# Patient Record
Sex: Female | Born: 1970 | Race: White | Hispanic: No | Marital: Single | Smoking: Current every day smoker
Health system: Southern US, Community
[De-identification: ages and names within clinical notes are randomized; demographics above are authoritative.]

## PROBLEM LIST (undated history)

## (undated) DIAGNOSIS — J189 Pneumonia, unspecified organism: Secondary | ICD-10-CM

## (undated) DIAGNOSIS — B1920 Unspecified viral hepatitis C without hepatic coma: Secondary | ICD-10-CM

## (undated) DIAGNOSIS — F1910 Other psychoactive substance abuse, uncomplicated: Secondary | ICD-10-CM

## (undated) DIAGNOSIS — I10 Essential (primary) hypertension: Secondary | ICD-10-CM

## (undated) DIAGNOSIS — Z8489 Family history of other specified conditions: Secondary | ICD-10-CM

## (undated) DIAGNOSIS — K219 Gastro-esophageal reflux disease without esophagitis: Secondary | ICD-10-CM

## (undated) DIAGNOSIS — F319 Bipolar disorder, unspecified: Secondary | ICD-10-CM

## (undated) HISTORY — PX: APPENDECTOMY: SHX54

## (undated) HISTORY — PX: ABDOMINAL HYSTERECTOMY: SHX81

## (undated) HISTORY — PX: VASCULAR SURGERY: SHX849

---

## 2004-05-19 ENCOUNTER — Inpatient Hospital Stay (HOSPITAL_COMMUNITY): Admission: EM | Admit: 2004-05-19 | Discharge: 2004-05-24 | Payer: Self-pay | Admitting: Psychiatry

## 2014-09-04 ENCOUNTER — Emergency Department (HOSPITAL_COMMUNITY)
Admission: EM | Admit: 2014-09-04 | Discharge: 2014-09-04 | Disposition: A | Discharge status: Home / Self Care | Payer: Medicare HMO | Attending: Emergency Medicine | Admitting: Emergency Medicine

## 2014-09-04 ENCOUNTER — Encounter (HOSPITAL_COMMUNITY): Payer: Self-pay | Admitting: Emergency Medicine

## 2014-09-04 DIAGNOSIS — Z720 Tobacco use: Secondary | ICD-10-CM | POA: Diagnosis not present

## 2014-09-04 DIAGNOSIS — Z79899 Other long term (current) drug therapy: Secondary | ICD-10-CM | POA: Diagnosis not present

## 2014-09-04 DIAGNOSIS — I10 Essential (primary) hypertension: Secondary | ICD-10-CM | POA: Diagnosis not present

## 2014-09-04 DIAGNOSIS — F1110 Opioid abuse, uncomplicated: Secondary | ICD-10-CM | POA: Diagnosis present

## 2014-09-04 DIAGNOSIS — F1123 Opioid dependence with withdrawal: Secondary | ICD-10-CM | POA: Insufficient documentation

## 2014-09-04 HISTORY — DX: Essential (primary) hypertension: I10

## 2014-09-04 NOTE — ED Notes (Signed)
Bed: WA06 Expected date:  Expected time:  Means of arrival:  Comments: EMS- heroin withdrawl

## 2014-09-04 NOTE — Discharge Instructions (Signed)
Chemical Dependency °Chemical dependency is an addiction to drugs or alcohol. It is characterized by the repeated behavior of seeking out and using drugs and alcohol despite harmful consequences to the health and safety of ones self and others.  °RISK FACTORS °There are certain situations or behaviors that increase a person's risk for chemical dependency. These include: °· A family history of chemical dependency. °· A history of mental health issues, including depression and anxiety. °· A home environment where drugs and alcohol are easily available to you. °· Drug or alcohol use at a young age. °SYMPTOMS  °The following symptoms can indicate chemical dependency: °· Inability to limit the use of drugs or alcohol. °· Nausea, sweating, shakiness, and anxiety that occurs when alcohol or drugs are not being used. °· An increase in amount of drugs or alcohol that is necessary to get drunk or high. °People who experience these symptoms can assess their use of drugs and alcohol by asking themselves the following questions: °· Have you been told by friends or family that they are worried about your use of alcohol or drugs? °· Do friends and family ever tell you about things you did while drinking alcohol or using drugs that you do not remember? °· Do you lie about using alcohol or drugs or about the amounts you use? °· Do you have difficulty completing daily tasks unless you use alcohol or drugs? °· Is the level of your work or school performance lower because of your drug or alcohol use? °· Do you get sick from using drugs or alcohol but keep using anyway? °· Do you feel uncomfortable in social situations unless you use alcohol or drugs? °· Do you use drugs or alcohol to help forget problems?  °An answer of yes to any of these questions may indicate chemical dependency. Professional evaluation is suggested. °Document Released: 10/12/2001 Document Revised: 01/10/2012 Document Reviewed: 12/24/2010 °ExitCare® Patient  Information ©2015 [employer]. This information is not intended to replace advice given to you by your health care provider. Make sure you discuss any questions you have with your health care provider. ° °Emergency Department Resource Guide °1) Find a Doctor and Pay Out of Pocket °Although you won't have to find out who is covered by your insurance plan, it is a good idea to ask around and get recommendations. You will then need to call the office and see if the doctor you have chosen will accept you as a new patient and what types of options they offer for patients who are self-pay. Some doctors offer discounts or will set up payment plans for their patients who do not have insurance, but you will need to ask so you aren't surprised when you get to your appointment. ° °2) Contact Your Local Health Department °Not all health departments have doctors that can see patients for sick visits, but many do, so it is worth a call to see if yours does. If you don't know where your local health department is, you can check in your phone book. The CDC also has a tool to help you locate your state's health department, and many state websites also have listings of all of their local health departments. ° °3) Find a Walk-in Clinic °If your illness is not likely to be very severe or complicated, you may want to try a walk in clinic. These are popping up all over the country in pharmacies, drugstores, and shopping centers. They're usually staffed by nurse practitioners or physician assistants that have been   trained to treat common illnesses and complaints. They're usually fairly quick and inexpensive. However, if you have serious medical issues or chronic medical problems, these are probably not your best option. ° °No Primary Care Doctor: °- Call [facility] at  832-8000 - they can help you locate a primary care doctor that  accepts your insurance, provides certain services, etc. °- Physician Referral Service-  1-800-533-3463 ° °Chronic Pain Problems: °Organization         Address  Phone   Notes  °[facility]  (336) 297-2271 Patients need to be referred by their primary care doctor.  ° °Medication Assistance: °Organization         Address  Phone   Notes  °[place] [facility] [address] °[place], [place] [address] (336) 641-8030 --Must be a resident of [place] °-- Must have NO insurance coverage whatsoever (no Medicaid/ Medicare, etc.) °-- The pt. MUST have a primary care doctor that directs their care regularly and follows them in the community °  °[facility]  (866) 331-1348   °[employer]  (888) 892-1162   ° °Agencies that provide inexpensive medical care: °Organization         Address  Phone   Notes  °[facility]  (336) 832-8035   °[facility]    (336) 832-7272   °[facility] [address] °[place], [place] [address] (336) 832-4777   °[facility] [address], °[place] (336) 271-4999   °[facility]    (336) 373-0678   °[facility]    (336) 272-1050   °[facility] ° [address], [place] Phone:  (336) 832-4444, Fax:  (336) 832-4440 Hours of Operation:  9 am - 6 pm, M-F.  Also accepts Medicaid/Medicare and self-pay.  °[facility] ° [address], [place] Phone: (336) 832-3150, Fax: (336) 832-3151. Hours of Operation:  8:30 am - 5:30 pm, M-F.  Also accepts Medicaid and self-pay.  °[facility] [place] [address], [place] Phone: (336) 878-6027   °[facility] [address], [place], [place] (336)723-1848, Ext. 123 Mondays & Thursdays: 7-9 AM.  First 15 patients are seen on a first come, first serve basis. °  ° °Medicaid-accepting [place] Providers: ° °Organization         Address  Phone   Notes  °[facility] [address],  [place] (336) 641-2100 Also accepts self-pay patients.  °[facility] [address], [place] ° (336) 856-9996   °[facility] [address], [place] (336) 288-8857   °[facility] [address] [place] [address], [place] (336) 299-7000   °Veita Bland [address], [place]  ° (336) 373-1557 Only accepts [place] Access Medicaid patients after they have their name applied to their card.  ° °Self-Pay (no insurance) in [place]: ° °Organization         Address  Phone   Notes  °Sickle Cell Patients, [facility] [address], [place] (336) 832-1970   °[facility] [address], [place] (336) 832-4400   ° [facility] ° [address], [place] (336) 992-4800   °[facility]/Dr. Osei-Bonsu ° [address] [place] [address],  or [address], [place] (336) 841-8500 Phone number for both [place] and [place] locations is the same.  °[facility] [address], [place] (336) 299-0000   °[facility] [address] [place] [address], [place] or [address] (336) 852-7530 °(336) 878-2260   °[facility] [address], [place] (336) 350-1642, phone; (336) 294-5005, fax Sees patients 1st and 3rd Saturday of every month.  Must not qualify for public or private insurance (i.e. Medicaid, Medicare, [place] Health Choice, Veterans' Benefits) • Household income should be no more than 200% of the poverty level •The clinic cannot treat you if you are pregnant or think you are pregnant • Sexually transmitted diseases are not treated at the clinic.  ° ° °Dental Care: °Organization         Address  Phone  Notes  °[facility] [address], [place] (336) 641-6152 Accepts children up to age 21 who are enrolled in  Medicaid or [place] Health Choice; pregnant women with a Medicaid card; and children who have applied for Medicaid or [place] Health Choice, but were declined, whose parents can pay a reduced fee at time of service.  °[facility] [place]  [address], [place] (336) 641-7733 Accepts children up to age 21 who are enrolled in Medicaid or [place] Health Choice; pregnant women with a Medicaid card; and children who have applied for Medicaid or [place] Health Choice, but were declined, whose parents can pay a reduced fee at time of service.  °[facility] ° [address], [place] (336) 641-4533 Patients are seen by appointment only. Walk-ins are not accepted. [facility] will see patients 18 years of age and older. °Monday - Tuesday (8am-5pm) °Most Wednesdays (8:30-5pm) °$30 per visit, cash only  °[facility] ° [address], [place] (336) 641-4533 Patients are seen by appointment only. Walk-ins are not accepted. [facility] will see patients 18 years of age and older. °One Wednesday Evening (Monthly: Volunteer Based).  $30 per visit, cash only  °[facility]  (919) 537-3737 for adults; Children under age 4, call Graduate Pediatric Dentistry at (919) [phone]. Children aged 4-14, please call (919) [phone] to request a pediatric application. ° Dental services are provided in all areas of dental care including fillings, crowns and bridges, complete and partial dentures, implants, gum treatment, root canals, and extractions. Preventive care is also provided. Treatment is provided to both adults and children. °Patients are selected via a lottery and there is often a waiting list. °  °[facility] [address], ° ° (336) 763-8833 [email or web address] °  °[facility] [address], [place], [place] (336)723-1848, Ext. 123 Second and Fourth Thursday of each month, opens at 6:30  AM; Clinic ends at 9 AM.  Patients are seen on a first-come first-served basis, and a limited number are seen during each clinic.  ° °[facility] ° [address], [place], [place] (336) 723-7904   Eligibility Requirements °You must have lived in [place], or [place] counties for at least the last three months. °  You cannot be eligible for state or federal sponsored healthcare insurance, including Veterans Administration, Medicaid, or Medicare. °  You generally cannot be eligible for healthcare insurance through your employer.  °  How to apply: °Eligibility screenings are   held every Tuesday and Wednesday afternoon from 1:00 pm until 4:00 pm. You do not need an appointment for the interview!  °[facility] [address], [place], [place] 336-631-2330   °[facility]  336-342-8273   °[facility]  336-703-3100   °[facility]  336-570-6415   ° °Behavioral Health Resources in the Community: °Intensive Outpatient Programs °Organization         Address  Phone  Notes  °[facility] [address], [place], [place] 336-878-6098   °[facility] [address], [place], [place] 336-832-9800   °[facility] [address], [place], [place] ° 336-882-2125   °[facility] [address],  °[place], [place] 1-800-853-5163 or 336-641-4981   °Substance Abuse Resources °Organization         Address  Phone  Notes  °[facility]  336-882-2125   °[facility]  336-784-9470   °[facility]  336-285-9073   °[facility]  336-845-3988   °Residential & Outpatient Substance Abuse Program  1-800-659-3381   °Psychological Services °Organization         Address  Phone  Notes  °[facility]  336- 832-9600   °[facility]  336- 378-7881   °[facility] [address], [place] 1-800-853-5163 or  336-641-4981   ° °Mobile Crisis Teams °Organization         Address  Phone  Notes  °[facility], Mobile Crisis Care Unit  1-877-626-1772   °Assertive °[facility] ° [address] [place], [place] 336-834-9664   °Sharon DeEsch [address] °[place] 336-554-5454   ° °Self-Help/Support Groups °Organization         Address  Phone             Notes  °[facility] - variety of support groups  336- 373-1402 Call for more information  °Narcotics Anonymous (NA), [facility] [address], °[place]  2 meetings at this location  ° °Residential Treatment Programs °Organization         Address  Phone  Notes  °[facility] [address],    °[place]  1-866-801-8205   °[facility] ° [address], [place], [place] 704-293-8524   °[facility] [address], [place] 336-845-3988 Admissions: 8am-3pm M-F  °[facility] [address]    °[place], [place] 336-841-1104   °[facility] [address], [place], [place] 336-379-7146   °[facility] [address]  °[place], [place] 336-285-9073   °[facility] - Intensive Outpatient [address], [place], [place] 336-852-3033   °[facility] [address]  °[place], [place] 1-877-615-2722 or 336-784-9470   °[facility] [address] [place], [place] 336-227-7417 Accepts Medicaid  °[facility] [address]  °[place] 1-800-659-3381 Substance Abuse/Addiction Treatment  ° °[place] Behavioral Health Resources °Organization         Address  Phone  Notes  °[facility]  (888) 581-9988   °Julie Brannon, PhD [address] [place], [place]   (336) 349-5553 or (336) 951-0000   °[facility]   [address] °[place], [place] (336) 349-4454   °[facility] [address],  [place],  (336) 342-8316 Insurance/Medicaid/sponsorship   through [facility]  °[facility] [address]                                    [place], [place] (336) 342-8316 Therapy/tele-psych/case  °[facility] [address]  ° [place], [place] (336) 349-2233    °Dr. Arfeen  (336) 349-4544   °[facility] [place]  [employer] [place] [facility] 1) [address], [place] °2) [address], [place] °3)  [address], [place] (336) 349-3220 °(336) 342-7768 ° °(336) 342-8140   °[place] [facility] (336) 342-1394 or (336) 342-3537 (After Hours)    ° ° °

## 2014-09-04 NOTE — ED Notes (Signed)
Patient refused to sign and go over discharge information. Showed patient and friend [facility] information on AVS as requested. Friend currently verbally accosting staff because we do not have heroin withdrawal resources.

## 2014-09-04 NOTE — ED Notes (Signed)
Per EMS-usually has 2 grams of Heroin but only had 0.5 grams today. C/o generalized pain. Attempting to set up with a clinic at this time. A&Ox4. Dry heaves with nausea noted. Says she has had withdrawals in the past and this feels the same. VS: BP 128/80 CBG 130 HR 90 Temp WDL (97 F). Hx Bipolar. Takes Lisinopril at home.

## 2014-09-04 NOTE — ED Provider Notes (Signed)
CSN: [id number]     Arrival date & time 09/04/14  1422 History   First MD Initiated Contact with Patient 09/04/14 1500     Chief Complaint  Patient presents with  . Withdrawal    Heroin     (Consider location/radiation/quality/duration/timing/severity/associated sxs/prior Treatment) HPI Comments: 3F using heroin for last year, wants detox. No suicidal ideation, no homicidal ideation. No concurrent drug use.   Patient is a 43 y.o. female presenting with drug problem. The history is provided by the patient.  Drug Problem This is a chronic problem. The current episode started more than 1 week ago. The problem occurs constantly. The problem has not changed since onset.Pertinent negatives include no chest pain and no abdominal pain. Nothing aggravates the symptoms. Nothing relieves the symptoms.    Past Medical History  Diagnosis Date  . Hypertension    Past Surgical History  Procedure Laterality Date  . Vascular surgery    . Appendectomy     History reviewed. No pertinent family history. History  Substance Use Topics  . Smoking status: Current Every Day Smoker -- 1.00 packs/day    Types: Cigarettes  . Smokeless tobacco: Not on file  . Alcohol Use: Yes   OB History    No data available     Review of Systems  Constitutional: Negative for fever and chills.  Cardiovascular: Negative for chest pain.  Gastrointestinal: Negative for abdominal pain.  All other systems reviewed and are negative.     Allergies  Review of patient's allergies indicates no known allergies.  Home Medications   Prior to Admission medications   Medication Sig Start Date End Date Taking? Authorizing Provider  Aspirin-Acetaminophen (GOODYS BODY PAIN PO) Take 1 tablet by mouth 3 (three) times daily as needed (back pain).   Yes Historical Provider, MD  clonazePAM (KLONOPIN) 1 MG tablet Take 2 mg by mouth once.   Yes Historical Provider, MD  lisinopril (PRINIVIL,ZESTRIL) 10 MG tablet Take 10 mg by  mouth daily.   Yes Historical Provider, MD  omeprazole (PRILOSEC) 20 MG capsule Take 20 mg by mouth daily.   Yes Historical Provider, MD  pregabalin (LYRICA) 75 MG capsule Take 75 mg by mouth 2 (two) times daily.   Yes Historical Provider, MD   BP 128/79 mmHg  Pulse 79  Temp(Src) 97.9 F (36.6 C) (Oral)  Resp 16  Ht 5\' 9"  (1.753 m)  Wt 166 lb (75.297 kg)  BMI 24.50 kg/m2  SpO2 100% Physical Exam  Constitutional: She is oriented to person, place, and time. She appears well-developed and well-nourished. No distress.  HENT:  Head: Normocephalic and atraumatic.  Mouth/Throat: Oropharynx is clear and moist.  Eyes: EOM are normal. Pupils are equal, round, and reactive to light.  Neck: Normal range of motion. Neck supple.  Cardiovascular: Normal rate and regular rhythm.  Exam reveals no friction rub.   No murmur heard. Pulmonary/Chest: Effort normal and breath sounds normal. No respiratory distress. She has no wheezes. She has no rales.  Abdominal: Soft. She exhibits no distension. There is no tenderness. There is no rebound.  Musculoskeletal: Normal range of motion. She exhibits no edema.  Neurological: She is alert and oriented to person, place, and time. No cranial nerve deficit. She exhibits normal muscle tone. Coordination normal.  Skin: No rash noted. She is not diaphoretic.  Psychiatric: She expresses no homicidal and no suicidal ideation. She expresses no suicidal plans and no homicidal plans.  Nursing note and vitals reviewed.   ED Course  Procedures (including critical care time) Labs Review Labs Reviewed - No data to display  Imaging Review No results found.   EKG Interpretation None      MDM   Final diagnoses:  Heroin abuse    89F here wanting opioid detox - from heroin. Denies SI/HI. Did not go to any behavioral health services prior to here. I explained to her we do not offer inpatient detox for heroin out of our ER at this time. Given resource guide for  follow up.     Elwin MochaBlair Janila Arrazola, MD 09/04/14 [phone]

## 2014-09-04 NOTE — ED Notes (Signed)
Per CSW, [facility] is not longer offering and should not be considered Medical Clearance.  Primary RN notified.

## 2014-10-21 ENCOUNTER — Ambulatory Visit: Payer: Medicare HMO | Admitting: Internal Medicine

## 2014-11-04 ENCOUNTER — Encounter (HOSPITAL_COMMUNITY): Payer: Self-pay | Admitting: *Deleted

## 2014-11-04 ENCOUNTER — Emergency Department (HOSPITAL_COMMUNITY)
Admission: EM | Admit: 2014-11-04 | Discharge: 2014-11-04 | Disposition: A | Discharge status: Home / Self Care | Payer: Medicare HMO | Attending: Emergency Medicine | Admitting: Emergency Medicine

## 2014-11-04 DIAGNOSIS — K0889 Other specified disorders of teeth and supporting structures: Secondary | ICD-10-CM

## 2014-11-04 DIAGNOSIS — Z7982 Long term (current) use of aspirin: Secondary | ICD-10-CM | POA: Insufficient documentation

## 2014-11-04 DIAGNOSIS — K088 Other specified disorders of teeth and supporting structures: Secondary | ICD-10-CM | POA: Diagnosis present

## 2014-11-04 DIAGNOSIS — I10 Essential (primary) hypertension: Secondary | ICD-10-CM | POA: Diagnosis not present

## 2014-11-04 DIAGNOSIS — Z79899 Other long term (current) drug therapy: Secondary | ICD-10-CM | POA: Diagnosis not present

## 2014-11-04 DIAGNOSIS — Z720 Tobacco use: Secondary | ICD-10-CM | POA: Insufficient documentation

## 2014-11-04 DIAGNOSIS — K029 Dental caries, unspecified: Secondary | ICD-10-CM | POA: Diagnosis not present

## 2014-11-04 MED ORDER — NAPROXEN 500 MG PO TABS: 500.0000 mg | ORAL_TABLET | Freq: Two times a day (BID) | ORAL | Status: DC

## 2014-11-04 MED ORDER — BUPIVACAINE HCL 0.5 % IJ SOLN
5.0000 mL | Freq: Once | INTRAMUSCULAR | Status: AC
  Administered 2014-11-04: 5 mL
  Filled 2014-11-04: qty 5

## 2014-11-04 MED ORDER — OXYCODONE-ACETAMINOPHEN 5-325 MG PO TABS: 2.0000 | ORAL_TABLET | ORAL | Status: DC | PRN

## 2014-11-04 MED ORDER — PENICILLIN V POTASSIUM 500 MG PO TABS: 500.0000 mg | ORAL_TABLET | Freq: Four times a day (QID) | ORAL | Status: AC

## 2014-11-04 MED ORDER — OXYCODONE-ACETAMINOPHEN 5-325 MG PO TABS
1.0000 | ORAL_TABLET | Freq: Once | ORAL | Status: AC
  Administered 2014-11-04: 1 via ORAL
  Filled 2014-11-04: qty 1

## 2014-11-04 NOTE — ED Provider Notes (Signed)
CSN: [id number]     Arrival date & time 11/04/14  1752 History  This chart was scribed for non-physician practitioner, Joycie Peek, PA-C working with Joya Gaskins, MD by Greggory Stallion, ED scribe. This patient was seen in room TR10C/TR10C and the patient's care was started at 7:04 PM.   Chief Complaint  Patient presents with  . Dental Pain   The history is provided by the patient. No language interpreter was used.    HPI Comments: Susan Bryan is a 44 y.o. female who presents to the Emergency Department complaining of worsening right upper dental pain that started yesterday. Rates pain 10/10 and describes it as throbbing. Pt has taken goody powders with no relief. She has also used Orajel and salt water swishes with no relief. Cold air worsens pain. States she has tried calling her old Education officer, community but was told it could cost up to $600 and she can't afford that. Denies fever, trouble swallowing, difficulty breathing.   Past Medical History  Diagnosis Date  . Hypertension    Past Surgical History  Procedure Laterality Date  . Vascular surgery    . Appendectomy     History reviewed. No pertinent family history. History  Substance Use Topics  . Smoking status: Current Every Day Smoker -- 1.00 packs/day    Types: Cigarettes  . Smokeless tobacco: Not on file  . Alcohol Use: Yes   OB History    No data available     Review of Systems  Constitutional: Negative for fever.  HENT: Positive for dental problem. Negative for trouble swallowing.   All other systems reviewed and are negative.  Allergies  Review of patient's allergies indicates no known allergies.  Home Medications   Prior to Admission medications   Medication Sig Start Date End Date Taking? Authorizing Provider  Aspirin-Acetaminophen (GOODYS BODY PAIN PO) Take 1 tablet by mouth 3 (three) times daily as needed (back pain).    Historical Provider, MD  clonazePAM (KLONOPIN) 1 MG tablet Take 2 mg by mouth once.     Historical Provider, MD  lisinopril (PRINIVIL,ZESTRIL) 10 MG tablet Take 10 mg by mouth daily.    Historical Provider, MD  naproxen (NAPROSYN) 500 MG tablet Take 1 tablet (500 mg total) by mouth 2 (two) times daily. 11/04/14   Earle Gell Taja Pentland, PA-C  omeprazole (PRILOSEC) 20 MG capsule Take 20 mg by mouth daily.    Historical Provider, MD  oxyCODONE-acetaminophen (PERCOCET) 5-325 MG per tablet Take 2 tablets by mouth every 4 (four) hours as needed. 11/04/14   Earle Gell Elya Diloreto, PA-C  penicillin v potassium (VEETID) 500 MG tablet Take 1 tablet (500 mg total) by mouth 4 (four) times daily. 11/04/14 11/11/14  Earle Gell Karlisa Gaubert, PA-C  pregabalin (LYRICA) 75 MG capsule Take 75 mg by mouth 2 (two) times daily.    Historical Provider, MD   BP 145/87 mmHg  Pulse 77  Temp(Src) 98.3 F (36.8 C) (Oral)  Resp 18  SpO2 100%   Physical Exam  Constitutional: She is oriented to person, place, and time. She appears well-developed and well-nourished. No distress.  HENT:  Head: Normocephalic and atraumatic.  Mouth/Throat: Oropharynx is clear and moist.   Overall poor dentition. Multiple missing teeth with active caries. Tenderness to tooth #2 and 3. Mucous membranes are moist. No unilateral tonsillar swelling, uvula midline, no glossal swelling or elevation. No trismus. No fluctuance or evidence of a drainable abscess. No other evidence of emergent infection, Ludwig or Vincents angina. Tolerating secretions  well. Patent airway   Eyes: Conjunctivae and EOM are normal. Pupils are equal, round, and reactive to light. Right eye exhibits no discharge. Left eye exhibits no discharge. No scleral icterus.  Neck: Neck supple. No tracheal deviation present.  Cardiovascular: Normal rate, regular rhythm and normal heart sounds.   Pulmonary/Chest: Effort normal and breath sounds normal. No respiratory distress. She has no wheezes. She has no rhonchi. She has no rales.  Clear to auscultation.  Abdominal: Soft. There is no  tenderness.  Musculoskeletal: Normal range of motion. She exhibits no tenderness.  Lymphadenopathy:    She has no cervical adenopathy.  Neurological: She is alert and oriented to person, place, and time.  Cranial Nerves II-XII grossly intact  Skin: Skin is warm and dry. No rash noted.  Psychiatric: She has a normal mood and affect. Her behavior is normal.  Nursing note and vitals reviewed.   ED Course  NERVE BLOCK Date/Time: 11/04/2014 8:02 PM Performed by: Sharlene Motts Authorized by: Sharlene Motts Consent: Verbal consent obtained. Risks and benefits: risks, benefits and alternatives were discussed Consent given by: patient Patient identity confirmed: verbally with patient Time out: Immediately prior to procedure a "time out" was called to verify the correct patient, procedure, equipment, support staff and site/side marked as required. Indications: pain relief Body area: face/mouth Nerve: middle superior alveolar Laterality: right Patient sedated: no Patient position: sitting Needle gauge: 27 G Location technique: anatomical landmarks Local anesthetic: bupivacaine 0.5% without epinephrine Outcome: pain improved Patient tolerance: Patient tolerated the procedure well with no immediate complications   (including critical care time)  DIAGNOSTIC STUDIES: Oxygen Saturation is 100% on RA, normal by my interpretation.    COORDINATION OF CARE: 7:08 PM-Discussed treatment plan which includes dental block, an antibiotic and pain medication with pt at bedside and pt agreed to plan. Will give pt dental referrals and advised her to follow up.   Labs Review Labs Reviewed - No data to display  Imaging Review No results found.   EKG Interpretation None     Meds given in ED:  Medications  bupivacaine (MARCAINE) 0.5 % (with pres) injection 5 mL (not administered)  oxyCODONE-acetaminophen (PERCOCET/ROXICET) 5-325 MG per tablet 1 tablet (1 tablet Oral Given 11/04/14 1836)     New Prescriptions   NAPROXEN (NAPROSYN) 500 MG TABLET    Take 1 tablet (500 mg total) by mouth 2 (two) times daily.   OXYCODONE-ACETAMINOPHEN (PERCOCET) 5-325 MG PER TABLET    Take 2 tablets by mouth every 4 (four) hours as needed.   PENICILLIN V POTASSIUM (VEETID) 500 MG TABLET    Take 1 tablet (500 mg total) by mouth 4 (four) times daily.   Filed Vitals:   11/04/14 1803 11/04/14 2001  BP: 142/95 145/87  Pulse: 79 77  Temp: 98.3 F (36.8 C)   TempSrc: Oral   Resp: 18 18  SpO2: 100% 100%    MDM  Kriss Perleberg is a 44 y.o. female who presents to the Emergency Department complaining of worsening right upper dental pain that started yesterday. Patient does not have dentist.  Vitals stable - WNL -afebrile Pt resting comfortably in ED. patient is pain-free following oral nerve block in ED. PE--patient with overall poor dentition. Mild erosion to posterior right upper molars. No evidence of drainable abscess. No unilateral tonsillar swelling, no glossal elevation, no trismus. Patient is tolerating secretions well, patent airway. No evidence of respiratory distress.   Low concern for peritonsillar abscess, Ludwig angina, vincents angina, other  acute or emergent pathology.  Will DC with antibiotics and pain medicine, referral given for dental evaluation. Discussed f/u with PCP and return precautions, pt very amenable to plan. Patient stable, in good condition and is appropriate for discharge  Final diagnoses:  Pain, dental      I personally performed the services described in this documentation, which was scribed in my presence. The recorded information has been reviewed and is accurate.  Earle Gell Hytop, PA-C 11/04/14 2010  Joya Gaskins, MD 11/05/14 [phone]

## 2014-11-04 NOTE — ED Notes (Signed)
Pt reports having severe right upper dental pain, airway intact.

## 2014-11-04 NOTE — Discharge Instructions (Signed)
Dental Pain °A tooth ache may be caused by cavities (tooth decay). Cavities expose the nerve of the tooth to air and hot or cold temperatures. It may come from an infection or abscess (also called a boil or furuncle) around your tooth. It is also often caused by dental caries (tooth decay). This causes the pain you are having. °DIAGNOSIS  °Your caregiver can diagnose this problem by exam. °TREATMENT  °· If caused by an infection, it may be treated with medications which kill germs (antibiotics) and pain medications as prescribed by your caregiver. Take medications as directed. °· Only take over-the-counter or prescription medicines for pain, discomfort, or fever as directed by your caregiver. °· Whether the tooth ache today is caused by infection or dental disease, you should see your dentist as soon as possible for further care. °SEEK MEDICAL CARE IF: °The exam and treatment you received today has been provided on an emergency basis only. This is not a substitute for complete medical or dental care. If your problem worsens or new problems (symptoms) appear, and you are unable to meet with your dentist, call or return to this location. °SEEK IMMEDIATE MEDICAL CARE IF:  °· You have a fever. °· You develop redness and swelling of your face, jaw, or neck. °· You are unable to open your mouth. °· You have severe pain uncontrolled by pain medicine. °MAKE SURE YOU:  °· Understand these instructions. °· Will watch your condition. °· Will get help right away if you are not doing well or get worse. °Document Released: 10/18/2005 Document Revised: 01/10/2012 Document Reviewed: 06/05/2008 °[employer]® Patient Information ©2015 [employer]. This information is not intended to replace advice given to you by your health care provider. Make sure you discuss any questions you have with your health care provider. ° °Dental Care and Dentist Visits °Dental care supports good overall health. Regular dental visits can also help you  avoid dental pain, bleeding, infection, and other more serious health problems in the future. It is important to keep the mouth healthy because diseases in the teeth, gums, and other oral tissues can spread to other areas of the body. Some problems, such as diabetes, heart disease, and pre-term labor have been associated with poor oral health.  °See your dentist every 6 months. If you experience emergency problems such as a toothache or broken tooth, go to the dentist right away. If you see your dentist regularly, you may catch problems early. It is easier to be treated for problems in the early stages.  °WHAT TO EXPECT AT A DENTIST VISIT  °Your dentist will look for many common oral health problems and recommend proper treatment. At your regular dental visit, you can expect: °· Gentle cleaning of the teeth and gums. This includes scraping and polishing. This helps to remove the sticky substance around the teeth and gums (plaque). Plaque forms in the mouth shortly after eating. Over time, plaque hardens on the teeth as tartar. If tartar is not removed regularly, it can cause problems. Cleaning also helps remove stains. °· Periodic X-rays. These pictures of the teeth and supporting bone will help your dentist assess the health of your teeth. °· Periodic fluoride treatments. Fluoride is a natural mineral shown to help strengthen teeth. Fluoride treatment involves applying a fluoride gel or varnish to the teeth. It is most commonly done in children. °· Examination of the mouth, tongue, jaws, teeth, and gums to look for any oral health problems, such as: °¨ Cavities (dental caries). This is   decay on the tooth caused by plaque, sugar, and acid in the mouth. It is best to catch a cavity when it is small.  Inflammation of the gums caused by plaque buildup (gingivitis).  Problems with the mouth or malformed or misaligned teeth.  Oral cancer or other diseases of the soft tissues or jaws. KEEP YOUR TEETH AND GUMS  HEALTHY For healthy teeth and gums, follow these general guidelines as well as your dentist's specific advice:  Have your teeth professionally cleaned at the dentist every 6 months.  Brush twice daily with a fluoride toothpaste.  Floss your teeth daily.  Ask your dentist if you need fluoride supplements, treatments, or fluoride toothpaste.  Eat a healthy diet. Reduce foods and drinks with added sugar.  Avoid smoking. TREATMENT FOR ORAL HEALTH PROBLEMS If you have oral health problems, treatment varies depending on the conditions present in your teeth and gums.  Your caregiver will most likely recommend good oral hygiene at each visit.  For cavities, gingivitis, or other oral health disease, your caregiver will perform a procedure to treat the problem. This is typically done at a separate appointment. Sometimes your caregiver will refer you to another dental specialist for specific tooth problems or for surgery. SEEK IMMEDIATE DENTAL CARE IF:  You have pain, bleeding, or soreness in the gum, tooth, jaw, or mouth area.  A permanent tooth becomes loose or separated from the gum socket.  You experience a blow or injury to the mouth or jaw area. Document Released: 06/30/2011 Document Revised: 01/10/2012 Document Reviewed: 06/30/2011 [facility] Patient Information 2015 [place], [place]. This information is not intended to replace advice given to you by your health care provider. Make sure you discuss any questions you have with your health care provider.  Please take your antibiotics as prescribed. He may take your pain medicine when you have severe pain. Continue to take naproxen for mild to moderate pain. Please follow-up with the dentist for definitive care. Return to ED for worsening symptoms.

## 2014-12-25 ENCOUNTER — Encounter (HOSPITAL_COMMUNITY): Payer: Self-pay | Admitting: Emergency Medicine

## 2014-12-25 ENCOUNTER — Emergency Department (HOSPITAL_COMMUNITY)
Admission: EM | Admit: 2014-12-25 | Discharge: 2014-12-25 | Disposition: A | Discharge status: Home / Self Care | Payer: Medicare HMO | Attending: Emergency Medicine | Admitting: Emergency Medicine

## 2014-12-25 DIAGNOSIS — K088 Other specified disorders of teeth and supporting structures: Secondary | ICD-10-CM | POA: Diagnosis present

## 2014-12-25 DIAGNOSIS — K029 Dental caries, unspecified: Secondary | ICD-10-CM | POA: Diagnosis not present

## 2014-12-25 DIAGNOSIS — Z79899 Other long term (current) drug therapy: Secondary | ICD-10-CM | POA: Diagnosis not present

## 2014-12-25 DIAGNOSIS — Z720 Tobacco use: Secondary | ICD-10-CM | POA: Diagnosis not present

## 2014-12-25 DIAGNOSIS — Z791 Long term (current) use of non-steroidal anti-inflammatories (NSAID): Secondary | ICD-10-CM | POA: Diagnosis not present

## 2014-12-25 DIAGNOSIS — K0889 Other specified disorders of teeth and supporting structures: Secondary | ICD-10-CM

## 2014-12-25 DIAGNOSIS — I10 Essential (primary) hypertension: Secondary | ICD-10-CM | POA: Insufficient documentation

## 2014-12-25 MED ORDER — IBUPROFEN 400 MG PO TABS
800.0000 mg | ORAL_TABLET | Freq: Once | ORAL | Status: AC
  Administered 2014-12-25: 800 mg via ORAL
  Filled 2014-12-25: qty 2

## 2014-12-25 MED ORDER — IBUPROFEN 800 MG PO TABS: 800.0000 mg | ORAL_TABLET | Freq: Three times a day (TID) | ORAL | Status: DC | PRN

## 2014-12-25 MED ORDER — PENICILLIN V POTASSIUM 500 MG PO TABS: 500.0000 mg | ORAL_TABLET | Freq: Four times a day (QID) | ORAL | Status: AC

## 2014-12-25 MED ORDER — TRAMADOL HCL 50 MG PO TABS: 50.0000 mg | ORAL_TABLET | Freq: Four times a day (QID) | ORAL | Status: DC | PRN

## 2014-12-25 NOTE — ED Provider Notes (Signed)
CSN: [id number]     Arrival date & time 12/25/14  1352 History  This chart was scribed for Susan Dredge, PA-C, working with Richardean Canal, MD by Leona Carry, ED Scribe. The patient was seen in TR11C/TR11C. The patient's care was started at 3:08 PM.   Chief Complaint  Patient presents with  . Dental Pain   Patient is a 44 y.o. female presenting with tooth pain. The history is provided by the patient. No language interpreter was used.  Dental Pain Associated symptoms: no fever and no neck pain    HPI Comments: Veralyn Lopp is a 44 y.o. female who presents to the Emergency Department complaining of dental pain beginning yesterday. Patient reports that she has experienced similar pain in the past. She denies fever, chills, or trouble swallowing.   Patient does not have a PCP.   Past Medical History  Diagnosis Date  . Hypertension    Past Surgical History  Procedure Laterality Date  . Vascular surgery    . Appendectomy    . Abdominal hysterectomy     No family history on file. History  Substance Use Topics  . Smoking status: Current Every Day Smoker -- 1.00 packs/day    Types: Cigarettes  . Smokeless tobacco: Not on file  . Alcohol Use: No   OB History    No data available     Review of Systems  Constitutional: Negative for fever and chills.  HENT: Positive for dental problem. Negative for trouble swallowing.   Respiratory: Negative for shortness of breath.   Musculoskeletal: Negative for neck pain and neck stiffness.  Skin: Negative for color change.  Allergic/Immunologic: Negative for immunocompromised state.  Hematological: Does not bruise/bleed easily.      Allergies  Review of patient's allergies indicates no known allergies.  Home Medications   Prior to Admission medications   Medication Sig Start Date End Date Taking? Authorizing Provider  Aspirin-Acetaminophen (GOODYS BODY PAIN PO) Take 1 tablet by mouth 3 (three) times daily as needed (back pain).     Historical Provider, MD  clonazePAM (KLONOPIN) 1 MG tablet Take 2 mg by mouth once.    Historical Provider, MD  lisinopril (PRINIVIL,ZESTRIL) 10 MG tablet Take 10 mg by mouth daily.    Historical Provider, MD  naproxen (NAPROSYN) 500 MG tablet Take 1 tablet (500 mg total) by mouth 2 (two) times daily. 11/04/14   Earle Gell Cartner, PA-C  omeprazole (PRILOSEC) 20 MG capsule Take 20 mg by mouth daily.    Historical Provider, MD  oxyCODONE-acetaminophen (PERCOCET) 5-325 MG per tablet Take 2 tablets by mouth every 4 (four) hours as needed. 11/04/14   Earle Gell Cartner, PA-C  pregabalin (LYRICA) 75 MG capsule Take 75 mg by mouth 2 (two) times daily.    Historical Provider, MD   Triage Vitals; BP 133/85 mmHg  Pulse 78  Temp(Src) 98 F (36.7 C) (Oral)  Resp 20  SpO2 100% Physical Exam  Constitutional: She appears well-developed and well-nourished. No distress.  HENT:  Head: Normocephalic and atraumatic.  Mouth/Throat: Uvula is midline and oropharynx is clear and moist. Mucous membranes are not dry. No uvula swelling. No oropharyngeal exudate, posterior oropharyngeal edema, posterior oropharyngeal erythema or tonsillar abscesses.  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 Tender to percussion.  Neck: Trachea normal, normal range of motion and phonation normal. Neck supple. No tracheal tenderness present. No rigidity. No tracheal deviation, no edema, no erythema and normal range of motion present.  Cardiovascular: Normal rate.  Pulmonary/Chest: Effort normal and breath sounds normal. No stridor.  Lymphadenopathy:    She has no cervical adenopathy.  Neurological: She is alert.  Skin: She is not diaphoretic.  Nursing note and vitals reviewed.   ED Course  Procedures (including critical care time) DIAGNOSTIC STUDIES: Oxygen Saturation is 100% on room air, normal by my interpretation.    COORDINATION OF CARE: 3:11 PM-Discussed treatment plan which includes ibuprofen and tramadol with pt at  bedside and pt agreed to plan.    Labs Review Labs Reviewed - No data to display  Imaging Review No results found.   EKG Interpretation None      MDM   Final diagnoses:  Pain, dental  Dental decay    Afebrile, nontoxic patient with new dental pain.  No obvious abscess.  No concerning findings on exam.  Doubt deep space head or neck infection.  Doubt Ludwig's angina.  D/C home with antibiotic, pain medication and dental follow up.  Discussed findings, treatment, and follow up  with patient.  Pt given return precautions.  Pt verbalizes understanding and agrees with plan.       I personally performed the services described in this documentation, which was scribed in my presence. The recorded information has been reviewed and is accurate.    Susan Dredgemily Haely Leyland, PA-C 12/25/14 1611  Richardean Canalavid H Yao, MD 12/26/14 [phone]

## 2014-12-25 NOTE — ED Notes (Signed)
Pt c/o dental pain, onset yesterday. Pt rocking back and forth on stretcher.

## 2014-12-25 NOTE — Discharge Instructions (Signed)
Read the information below.  Use the prescribed medication as directed.  Please discuss all new medications with your pharmacist.  You may return to the Emergency Department at any time for worsening condition or any new symptoms that concern you.    Please call the dentist listed above within 48 hours to schedule a close follow up appointment.  If you develop fevers, swelling in your face, difficulty swallowing or breathing, return to the ER immediately for a recheck.   ° °Dental Caries °Dental caries (also called tooth decay) is the most common oral disease. It can occur at any age but is more common in children and young adults.  °HOW DENTAL CARIES DEVELOPS  °The process of decay begins when bacteria and foods (particularly sugars and starches) combine in your mouth to produce plaque. Plaque is a substance that sticks to the hard, outer surface of a tooth (enamel). The bacteria in plaque produce acids that attack enamel. These acids may also attack the root surface of a tooth (cementum) if it is exposed. Repeated attacks dissolve these surfaces and create holes in the tooth (cavities). If left untreated, the acids destroy the other layers of the tooth.  °RISK FACTORS °· Frequent sipping of sugary beverages.   °· Frequent snacking on sugary and starchy foods, especially those that easily get stuck in the teeth.   °· Poor oral hygiene.   °· Dry mouth.   °· Substance abuse such as methamphetamine abuse.   °· Broken or poor-fitting dental restorations.   °· Eating disorders.   °· Gastroesophageal reflux disease (GERD).   °· Certain radiation treatments to the head and neck. °SYMPTOMS °In the early stages of dental caries, symptoms are seldom present. Sometimes white, chalky areas may be seen on the enamel or other tooth layers. In later stages, symptoms may include: °· Pits and holes on the enamel. °· Toothache after sweet, hot, or cold foods or drinks are consumed. °· Pain around the tooth. °· Swelling around the  tooth. °DIAGNOSIS  °Most of the time, dental caries is detected during a regular dental checkup. A diagnosis is made after a thorough medical and dental history is taken and the surfaces of your teeth are checked for signs of dental caries. Sometimes special instruments, such as lasers, are used to check for dental caries. Dental X-ray exams may be taken so that areas not visible to the eye (such as between the contact areas of the teeth) can be checked for cavities.  °TREATMENT  °If dental caries is in its early stages, it may be reversed with a fluoride treatment or an application of a remineralizing agent at the dental office. Thorough brushing and flossing at home is needed to aid these treatments. If it is in its later stages, treatment depends on the location and extent of tooth destruction:  °· If a small area of the tooth has been destroyed, the destroyed area will be removed and cavities will be filled with a material such as gold, silver amalgam, or composite resin.   °· If a large area of the tooth has been destroyed, the destroyed area will be removed and a cap (crown) will be fitted over the remaining tooth structure.   °· If the center part of the tooth (pulp) is affected, a procedure called a root canal will be needed before a filling or crown can be placed.   °· If most of the tooth has been destroyed, the tooth may need to be pulled (extracted). °HOME CARE INSTRUCTIONS °You can prevent, stop, or reverse dental caries at   home by practicing good oral hygiene. Good oral hygiene includes: °· Thoroughly cleaning your teeth at least twice a day with a toothbrush and dental floss.   °· Using a fluoride toothpaste. A fluoride mouth rinse may also be used if recommended by your dentist or health care provider.   °· Restricting the amount of sugary and starchy foods and sugary liquids you consume.   °· Avoiding frequent snacking on these foods and sipping of these liquids.   °· Keeping regular visits with a  dentist for checkups and cleanings. °PREVENTION  °· Practice good oral hygiene. °· Consider a dental sealant. A dental sealant is a coating material that is applied by your dentist to the pits and grooves of teeth. The sealant prevents food from being trapped in them. It may protect the teeth for several years. °· Ask about fluoride supplements if you live in a community without fluorinated water or with water that has a low fluoride content. Use fluoride supplements as directed by your dentist or health care provider. °· Allow fluoride varnish applications to teeth if directed by your dentist or health care provider. °Document Released: 07/10/2002 Document Revised: 03/04/2014 Document Reviewed: 10/20/2012 °ExitCare® Patient Information ©2015 [employer], LLC. This information is not intended to replace advice given to you by your health care provider. Make sure you discuss any questions you have with your health care provider. ° °Dental Pain °A tooth ache may be caused by cavities (tooth decay). Cavities expose the nerve of the tooth to air and hot or cold temperatures. It may come from an infection or abscess (also called a boil or furuncle) around your tooth. It is also often caused by dental caries (tooth decay). This causes the pain you are having. °DIAGNOSIS  °Your caregiver can diagnose this problem by exam. °TREATMENT  °· If caused by an infection, it may be treated with medications which kill germs (antibiotics) and pain medications as prescribed by your caregiver. Take medications as directed. °· Only take over-the-counter or prescription medicines for pain, discomfort, or fever as directed by your caregiver. °· Whether the tooth ache today is caused by infection or dental disease, you should see your dentist as soon as possible for further care. °SEEK MEDICAL CARE IF: °The exam and treatment you received today has been provided on an emergency basis only. This is not a substitute for complete medical or  dental care. If your problem worsens or new problems (symptoms) appear, and you are unable to meet with your dentist, call or return to this location. °SEEK IMMEDIATE MEDICAL CARE IF:  °· You have a fever. °· You develop redness and swelling of your face, jaw, or neck. °· You are unable to open your mouth. °· You have severe pain uncontrolled by pain medicine. °MAKE SURE YOU:  °· Understand these instructions. °· Will watch your condition. °· Will get help right away if you are not doing well or get worse. °Document Released: 10/18/2005 Document Revised: 01/10/2012 Document Reviewed: 06/05/2008 °ExitCare® Patient Information ©2015 ExitCare, LLC. This information is not intended to replace advice given to you by your health care provider. Make sure you discuss any questions you have with your health care provider. ° ° ° °Emergency Department Resource Guide °1) Find a Doctor and Pay Out of Pocket °Although you won't have to find out who is covered by your insurance plan, it is a good idea to ask around and get recommendations. You will then need to call the office and see if the doctor you have   chosen will accept you as a new patient and what types of options they offer for patients who are self-pay. Some doctors offer discounts or will set up payment plans for their patients who do not have insurance, but you will need to ask so you aren't surprised when you get to your appointment. ° °2) Contact Your Local Health Department °Not all health departments have doctors that can see patients for sick visits, but many do, so it is worth a call to see if yours does. If you don't know where your local health department is, you can check in your phone book. The CDC also has a tool to help you locate your state's health department, and many state websites also have listings of all of their local health departments. ° °3) Find a Walk-in Clinic °If your illness is not likely to be very severe or complicated, you may want to try  a walk in clinic. These are popping up all over the country in pharmacies, drugstores, and shopping centers. They're usually staffed by nurse practitioners or physician assistants that have been trained to treat common illnesses and complaints. They're usually fairly quick and inexpensive. However, if you have serious medical issues or chronic medical problems, these are probably not your best option. ° °No Primary Care Doctor: °- Call [facility] at  832-8000 - they can help you locate a primary care doctor that  accepts your insurance, provides certain services, etc. °- Physician Referral Service- 1-800-533-3463 ° °Chronic Pain Problems: °Organization         Address  Phone   Notes  °[facility]  (336) 297-2271 Patients need to be referred by their primary care doctor.  ° °Medication Assistance: °Organization         Address  Phone   Notes  °[place] [facility] [address] °[place], [place] [address] (336) 641-8030 --Must be a resident of [place] °-- Must have NO insurance coverage whatsoever (no Medicaid/ Medicare, etc.) °-- The pt. MUST have a primary care doctor that directs their care regularly and follows them in the community °  °[facility]  (866) 331-1348   °[employer]  (888) 892-1162   ° °Agencies that provide inexpensive medical care: °Organization         Address  Phone   Notes  °[facility]  (336) 832-8035   ° Internal Medicine    (336) 832-7272   °[facility] [address] °[place], [place] [address] (336) 832-4777   °[facility] [address], °[place] (336) 271-4999   °[facility]    (336) 373-0678   °[facility]    (336) 272-1050   °[facility] ° [address], [place] Phone:  (336) 832-4444, Fax:  (336) 832-4440 Hours of Operation:  9 am - 6 pm, M-F.  Also accepts Medicaid/Medicare and self-pay.  °[facility] ° [address], [place] Phone: (336) [phone], Fax: (336) [phone]. Hours of Operation:  8:30 am - 5:30 pm, M-F.  Also accepts Medicaid and self-pay.  °[facility] [place] [address], [place] Phone: (336) 878-6027   °[facility] [address], [place], [place] (336)723-1848, Ext. 123 Mondays & Thursdays: 7-9 AM.  First 15 patients are seen on a first come, first serve basis. °  ° °Medicaid-accepting [place] Providers: ° °Organization           Address  Phone   Notes  °[facility] [address], [place] (336) 641-2100 Also accepts self-pay patients.  °[facility] [address], [place] ° (336) 856-9996   °[facility] [address], [place] (336) 288-8857   °[facility] [address] [place] [address], [place] (336) 299-7000   °Veita Bland [address], [place]  ° (336) 373-1557 Only accepts [place] Access Medicaid patients after they have their name applied to their card.  ° °Self-Pay (no insurance) in [place]: ° °Organization         Address  Phone   Notes  °Sickle Cell Patients, [facility] [address], [place] (336) 832-1970   °[facility] [address], [place] (336) 832-4400   °[facility] ° [address], [place] (336) 992-4800   °[facility]/Dr. Osei-Bonsu ° [address] [place] [address], [place] or [address], [place] (336) [phone] Phone number for both [place] and [place] locations is the same.  °[facility] [address], [place] (336) 299-0000   °[facility] [address] [place] [address], [place] or [address] (336) 852-7530 °(336) 878-2260   °[facility] [address], [place] (336) 350-1642, phone; (336) 294-5005, fax  Sees patients 1st and 3rd Saturday of every month.  Must not qualify for public or private insurance (i.e. Medicaid, Medicare, [place] Health Choice, Veterans' Benefits) • Household income should be no more than 200% of the poverty level •The clinic cannot treat you if you are pregnant or think you are pregnant • Sexually transmitted diseases are not treated at the clinic.  ° ° °Dental Care: °Organization         Address  Phone  Notes  °[facility] [address], [place] (336) 641-6152 Accepts children up to age 21 who are enrolled in Medicaid or [place] Health Choice; pregnant women with a Medicaid card; and children who have applied for Medicaid or [place] Health Choice, but were declined, whose parents can pay a reduced fee at time of service.  °[facility] [place]  [address], [place] (336) 641-7733 Accepts children up to age 21 who are enrolled in Medicaid or [place] Health Choice; pregnant women with a Medicaid card; and children who have applied for Medicaid or [place] Health Choice, but were declined, whose parents can pay a reduced fee at time of service.  °[facility] ° [address], [place] (336) 641-4533 Patients are seen by appointment only. Walk-ins are not accepted. [facility] will see patients 18 years of age and older. °Monday - Tuesday (8am-5pm) °Most Wednesdays (8:30-5pm) °$30 per visit, cash only  °[facility] ° [address], [place] (336) 641-4533 Patients are seen by appointment only. Walk-ins are not accepted. [facility] will see patients 18 years of age and older. °One Wednesday Evening (Monthly: Volunteer Based).  $30 per visit, cash only  °[facility]  (919) 537-3737 for adults; Children under age 4, call Graduate Pediatric Dentistry at ([phone]) [phone]. Children aged 4-14, please call (919) [phone] to  request a pediatric application. ° Dental services are provided in all areas of dental care including   fillings, crowns and bridges, complete and partial dentures, implants, gum treatment, root canals, and extractions. Preventive care is also provided. Treatment is provided to both adults and children. °Patients are selected via a lottery and there is often a waiting list. °  °[facility] [address], °[place] ° (336) 763-8833 [email or web address] °  °[facility] [address], [place], [place] (336)723-1848, Ext. 123 Second and Fourth Thursday of each month, opens at 6:30 AM; Clinic ends at 9 AM.  Patients are seen on a first-come first-served basis, and a limited number are seen during each clinic.  ° °[facility] ° [address], [place], [place] (336) 723-7904   Eligibility Requirements °You must have lived in [place], [place], or [place] counties for at least the last three months. °  You cannot be eligible for state or federal sponsored healthcare insurance, including Veterans Administration, Medicaid, or Medicare. °  You generally cannot be eligible for healthcare insurance through your employer.  °  How to apply: °Eligibility screenings are held every Tuesday and Wednesday afternoon from 1:00 pm until 4:00 pm. You do not need an appointment for the interview!  °[facility] [address], [place], [place] 336-631-2330   °[facility]  336-342-8273   °[facility]  336-703-3100   °[facility]  336-570-6415   ° °Behavioral Health Resources in the Community: °Intensive Outpatient Programs °Organization         Address  Phone  Notes  °[facility] [address], [place], [place] 336-878-6098   °[facility] [address], [place], [place] 336-832-9800   °[facility] [address], [place], [place] ° 336-882-2125   °[facility] [address],  °[place], [place] 1-800-853-5163 or 336-641-4981   °Substance Abuse Resources °Organization         Address  Phone  Notes  °[facility]  336-882-2125   °[facility]  336-784-9470   °[facility]  336-285-9073   °[facility]  336-845-3988   °Residential & Outpatient Substance Abuse Program  1-800-659-3381   °Psychological Services °Organization         Address  Phone  Notes  °[facility]  336- 832-9600   °[facility]  336- 378-7881   °[facility] [address], [place] 1-800-853-5163 or 336-641-4981   ° °Mobile Crisis Teams °Organization         Address  Phone  Notes  °[facility], Mobile Crisis Care Unit  1-877-626-1772   °Assertive °[facility] ° [address] [place], [place] 336-834-9664   °Sharon DeEsch [address] °[place] 336-554-5454   ° °Self-Help/Support Groups °Organization         Address  Phone             Notes  °[facility] [place] [facility] - variety of support groups  336- 373-1402 Call for more information  °Narcotics Anonymous (NA), [facility] [address], °[place]  2 meetings at this location  ° °Residential Treatment Programs °Organization         Address  Phone  Notes  °[facility] [address],    ° [place]  1-866-801-8205   °[facility] ° [address], [place], [place] 704-293-8524   °[facility] [address], [place] 336-845-3988 Admissions: 8am-3pm M-F  °[facility] [address]    °[place], [place] 336-841-1104   °[facility] [address], [place], [place] 336-379-7146   °[facility] [address]  °[place], [place] 336-285-9073   °[facility] - Intensive Outpatient [address], [place], [place] 336-852-3033   °[facility] [address]  °[place], [place] 1-877-615-2722 or 336-784-9470   °[facility] [address] [place], [place] 336-227-7417 Accepts Medicaid  °[facility] [address]  °[place] 1-800-659-3381 Substance Abuse/Addiction Treatment  ° °[place] Behavioral Health Resources °Organization         Address  Phone  Notes  °[facility]  (888) 581-9988   °Julie Brannon, PhD [address] [place], [place]   (336) 349-5553 or (336) 951-0000   °[facility]   [address] °[place], [place] (336) 349-4454   °[facility] [address], [place], [place] (336) 342-8316 Insurance/Medicaid/sponsorship through [facility]  °[facility] [address]                                    [place], [place] (336) 342-8316 Therapy/tele-psych/case  °[facility] [address]  ° [place], [place] (336) 349-2233    °Dr. Arfeen  (336) 349-4544   °[facility] [place]  [employer] [place] [facility] 1) [address], [place] °2) [address], [place] °3)  [address], [place] (336) 349-3220 °(336) 342-7768 ° °(336) 342-8140   °[place] [facility] (336) 342-1394 or (336) 342-3537 (After Hours)    ° ° ° °

## 2015-03-06 ENCOUNTER — Ambulatory Visit: Payer: Medicare HMO | Admitting: Psychology

## 2015-03-16 ENCOUNTER — Emergency Department (HOSPITAL_COMMUNITY): Payer: Medicare HMO

## 2015-03-16 ENCOUNTER — Encounter (HOSPITAL_COMMUNITY): Payer: Self-pay | Admitting: Oncology

## 2015-03-16 ENCOUNTER — Emergency Department (HOSPITAL_COMMUNITY)
Admission: EM | Admit: 2015-03-16 | Discharge: 2015-03-16 | Disposition: A | Discharge status: Home / Self Care | Payer: Medicare HMO | Attending: Emergency Medicine | Admitting: Emergency Medicine

## 2015-03-16 ENCOUNTER — Other Ambulatory Visit (HOSPITAL_COMMUNITY): Payer: Medicare HMO

## 2015-03-16 DIAGNOSIS — Y998 Other external cause status: Secondary | ICD-10-CM | POA: Insufficient documentation

## 2015-03-16 DIAGNOSIS — Z79899 Other long term (current) drug therapy: Secondary | ICD-10-CM | POA: Insufficient documentation

## 2015-03-16 DIAGNOSIS — Y9389 Activity, other specified: Secondary | ICD-10-CM | POA: Insufficient documentation

## 2015-03-16 DIAGNOSIS — I10 Essential (primary) hypertension: Secondary | ICD-10-CM | POA: Insufficient documentation

## 2015-03-16 DIAGNOSIS — F319 Bipolar disorder, unspecified: Secondary | ICD-10-CM | POA: Diagnosis not present

## 2015-03-16 DIAGNOSIS — Z720 Tobacco use: Secondary | ICD-10-CM | POA: Diagnosis not present

## 2015-03-16 DIAGNOSIS — W19XXXA Unspecified fall, initial encounter: Secondary | ICD-10-CM

## 2015-03-16 DIAGNOSIS — S9031XA Contusion of right foot, initial encounter: Secondary | ICD-10-CM | POA: Insufficient documentation

## 2015-03-16 DIAGNOSIS — Y92009 Unspecified place in unspecified non-institutional (private) residence as the place of occurrence of the external cause: Secondary | ICD-10-CM | POA: Insufficient documentation

## 2015-03-16 DIAGNOSIS — Z791 Long term (current) use of non-steroidal anti-inflammatories (NSAID): Secondary | ICD-10-CM | POA: Diagnosis not present

## 2015-03-16 DIAGNOSIS — W108XXA Fall (on) (from) other stairs and steps, initial encounter: Secondary | ICD-10-CM | POA: Diagnosis not present

## 2015-03-16 DIAGNOSIS — S99921A Unspecified injury of right foot, initial encounter: Secondary | ICD-10-CM | POA: Diagnosis present

## 2015-03-16 DIAGNOSIS — S90414A Abrasion, right lesser toe(s), initial encounter: Secondary | ICD-10-CM | POA: Diagnosis not present

## 2015-03-16 DIAGNOSIS — M79673 Pain in unspecified foot: Secondary | ICD-10-CM

## 2015-03-16 DIAGNOSIS — T148XXA Other injury of unspecified body region, initial encounter: Secondary | ICD-10-CM

## 2015-03-16 DIAGNOSIS — R4781 Slurred speech: Secondary | ICD-10-CM | POA: Diagnosis not present

## 2015-03-16 DIAGNOSIS — S92211A Displaced fracture of cuboid bone of right foot, initial encounter for closed fracture: Secondary | ICD-10-CM | POA: Diagnosis not present

## 2015-03-16 HISTORY — DX: Bipolar disorder, unspecified: F31.9

## 2015-03-16 MED ORDER — OXYCODONE-ACETAMINOPHEN 5-325 MG PO TABS: 1.0000 | ORAL_TABLET | Freq: Four times a day (QID) | ORAL | Status: DC | PRN

## 2015-03-16 MED ORDER — OXYCODONE-ACETAMINOPHEN 5-325 MG PO TABS
1.0000 | ORAL_TABLET | Freq: Once | ORAL | Status: AC
  Administered 2015-03-16: 1 via ORAL
  Filled 2015-03-16: qty 1

## 2015-03-16 NOTE — Discharge Instructions (Signed)
You have a chip off of the bone of the right part of your foot which dictates a bad sprain.  Wear Cam Walker and use crutches as needed.  Follow-up with orthopedics in one week for recheck.  Return to the emergency department for worsening condition or new concerning symptoms.  Try to keep foot elevated when possible.   Cryotherapy Cryotherapy is when you put ice on your injury. Ice helps lessen pain and puffiness (swelling) after an injury. Ice works the best when you start using it in the first 24 to 48 hours after an injury. HOME CARE  Put a dry or damp towel between the ice pack and your skin.  You may press gently on the ice pack.  Leave the ice on for no more than 10 to 20 minutes at a time.  Check your skin after 5 minutes to make sure your skin is okay.  Rest at least 20 minutes between ice pack uses.  Stop using ice when your skin loses feeling (numbness).  Do not use ice on someone who cannot tell you when it hurts. This includes small children and people with memory problems (dementia). GET HELP RIGHT AWAY IF:  You have white spots on your skin.  Your skin turns [place] or pale.  Your skin feels waxy or hard.  Your puffiness gets worse. MAKE SURE YOU:   Understand these instructions.  Will watch your condition.  Will get help right away if you are not doing well or get worse. Document Released: 04/05/2008 Document Revised: 01/10/2012 Document Reviewed: 06/10/2011 [facility] Patient Information 2015 [employer]Care, [place]. This information is not intended to replace advice given to you by your health care provider. Make sure you discuss any questions you have with your health care provider.

## 2015-03-16 NOTE — ED Notes (Signed)
Per EMS pt fell down 4 steps injuring right ankle.  Pt was able to get back up and get into her apartment.  Per EMS right ankle is swollen.  Pt denies LOC.

## 2015-03-16 NOTE — ED Notes (Signed)
Pt's speech is slurred and it appears difficult for pt to keep her eyes open.  Pt is extremely focused on pain medication stating that she was not given any narcotic pain medication last time she was here.

## 2015-03-16 NOTE — ED Notes (Signed)
Bed: [id number] Expected date:  Expected time:  Means of arrival:  Comments: EMS fall ankle and back pain

## 2015-03-16 NOTE — ED Provider Notes (Signed)
CSN: [id number]     Arrival date & time 03/16/15  0146 History   First MD Initiated Contact with Patient 03/16/15 [phone]     Chief Complaint  Patient presents with  . Fall     (Consider location/radiation/quality/duration/timing/severity/associated sxs/prior Treatment) HPI 44 year old female presents to emergency department after a fall at home.  Patient reports that she was going out to the car when she stumbled down as series of 4 concrete steps.  Patient twisted her right ankle and bruised her right elbow.  She reports that she bumped her head as well.  No LOC.  No nausea.  Patient has been unable to bear weight on her right foot since falling.  She reports her last tetanus shot was a year ago.  She has an abrasion to her lateral 3 toes.  No other injuries noted at this time Past Medical History  Diagnosis Date  . Hypertension   . Bipolar 1 disorder    Past Surgical History  Procedure Laterality Date  . Vascular surgery    . Appendectomy    . Abdominal hysterectomy     History reviewed. No pertinent family history. History  Substance Use Topics  . Smoking status: Current Every Day Smoker -- 1.00 packs/day    Types: Cigarettes  . Smokeless tobacco: Not on file  . Alcohol Use: Yes   OB History    No data available     Review of Systems   See History of Present Illness; otherwise all other systems are reviewed and negative  Allergies  Review of patient's allergies indicates no known allergies.  Home Medications   Prior to Admission medications   Medication Sig Start Date End Date Taking? Authorizing Provider  Aspirin-Acetaminophen (GOODYS BODY PAIN PO) Take 1 tablet by mouth 3 (three) times daily as needed (back pain).    Historical Provider, MD  clonazePAM (KLONOPIN) 1 MG tablet Take 2 mg by mouth once.    Historical Provider, MD  ibuprofen (ADVIL,MOTRIN) 800 MG tablet Take 1 tablet (800 mg total) by mouth every 8 (eight) hours as needed for mild pain or moderate pain.  12/25/14   Trixie DredgeEmily West, PA-C  lisinopril (PRINIVIL,ZESTRIL) 10 MG tablet Take 10 mg by mouth daily.    Historical Provider, MD  naproxen (NAPROSYN) 500 MG tablet Take 1 tablet (500 mg total) by mouth 2 (two) times daily. 11/04/14   Joycie PeekBenjamin Cartner, PA-C  omeprazole (PRILOSEC) 20 MG capsule Take 20 mg by mouth daily.    Historical Provider, MD  oxyCODONE-acetaminophen (PERCOCET) 5-325 MG per tablet Take 2 tablets by mouth every 4 (four) hours as needed. 11/04/14   Joycie PeekBenjamin Cartner, PA-C  pregabalin (LYRICA) 75 MG capsule Take 75 mg by mouth 2 (two) times daily.    Historical Provider, MD  traMADol (ULTRAM) 50 MG tablet Take 1 tablet (50 mg total) by mouth every 6 (six) hours as needed for moderate pain or severe pain. 12/25/14   Trixie DredgeEmily West, PA-C   BP 120/54 mmHg  Pulse 70  Temp(Src) 98.1 F (36.7 C) (Oral)  Resp 16  Ht 5\' 9"  (1.753 m)  Wt 170 lb (77.111 kg)  BMI 25.09 kg/m2  SpO2 95% Physical Exam  Constitutional: She is oriented to person, place, and time. She appears well-developed and well-nourished. She appears distressed.  Patient is somnolent, slurred speech.  She is complaining of severe pain and requesting pain medications frequently  HENT:  Head: Normocephalic and atraumatic.  Nose: Nose normal.  Mouth/Throat: Oropharynx is clear and moist.  Eyes: Conjunctivae and EOM are normal. Pupils are equal, round, and reactive to light.  Neck: Normal range of motion. Neck supple. No JVD present. No tracheal deviation present. No thyromegaly present.  Cardiovascular: Normal rate, regular rhythm, normal heart sounds and intact distal pulses.  Exam reveals no gallop and no friction rub.   No murmur heard. Pulmonary/Chest: Effort normal and breath sounds normal. No stridor. No respiratory distress. She has no wheezes. She has no rales. She exhibits no tenderness.  Abdominal: Soft. Bowel sounds are normal. She exhibits no distension and no mass. There is no tenderness. There is no rebound and no  guarding.  Musculoskeletal: Normal range of motion. She exhibits edema and tenderness.  She has abrasions to lateral 3 toes on right foot.  She has large contusion with swelling, ecchymosis to lateral right foot.  She has normal range of motion of the toes.  She has limited range of motion at the right ankle secondary to pain  Lymphadenopathy:    She has no cervical adenopathy.  Neurological: She is alert and oriented to person, place, and time. She displays normal reflexes. She exhibits normal muscle tone. Coordination normal.  Skin: Skin is warm and dry. No rash noted. No erythema. No pallor.  Psychiatric: She has a normal mood and affect. Her behavior is normal. Judgment and thought content normal.  Nursing note and vitals reviewed.   ED Course  Procedures (including critical care time) Labs Review Labs Reviewed - No data to display  Imaging Review Dg Ankle Complete Right  03/16/2015   ADDENDUM REPORT: 03/16/2015 02:26  ADDENDUM: Foot views demonstrate the avulsion fragment likely arises from the cuboidal bone.   Electronically Signed   By: Burman Nieves M.D.   On: 03/16/2015 02:26   03/16/2015   CLINICAL DATA:  Right ankle and foot pain after fall down 4 steps today. Swelling and bruising.  EXAM: RIGHT ANKLE - COMPLETE 3+ VIEW  COMPARISON:  None.  FINDINGS: There is an acute appearing avulsion fracture fragment arising from the lateral aspect of the right midfoot, probably arising from the base of the fifth metatarsal bone. There is overlying soft tissue swelling. A right ankle appears intact. No additional fractures are demonstrated. Prominent posterior process of the talus. Plantar calcaneal spur.  IMPRESSION: Acute avulsion fracture fragment appears to arise from the base of the fifth metatarsal bone.  Electronically Signed: By: Burman Nieves M.D. On: 03/16/2015 02:23   Dg Foot Complete Right  03/16/2015   CLINICAL DATA:  Right ankle and foot pain after a fall down 4 steps.  Swelling and bruising.  EXAM: RIGHT FOOT COMPLETE - 3+ VIEW  COMPARISON:  Right ankle 03/16/2015  FINDINGS: The fracture fragment demonstrated on the ankle views appears to arise from the cuboidal bone rather than the fifth metatarsal bone. No additional fractures demonstrated in the right foot. Lateral soft tissue swelling over the midfoot. Plantar calcaneal spur.  IMPRESSION: Avulsion fracture fragment demonstrated over the cuboidal bone. This corresponds to the lesion demonstrated on the previous ankle views.   Electronically Signed   By: Burman Nieves M.D.   On: 03/16/2015 02:25     EKG Interpretation None      MDM   Final diagnoses:  Fall at home, initial encounter  Avulsion fracture    44 year old female status post fall down stairs.  Contusion to right elbow, avulsion fracture of the cuboid bone.  Plan for a walker, crutches.  Patient has history of heroin abuse and I'm  reluctant to give her prolonged opiate medications.  I have stressed the patient that she will need follow-up with orthopedics given the avulsion fracture and probable need for physical therapy for good healing    Marisa Severinlga Makaylee Spielberg, MD 03/16/15 [phone]

## 2015-03-20 ENCOUNTER — Ambulatory Visit: Payer: Medicare HMO | Admitting: Psychology

## 2015-05-15 ENCOUNTER — Emergency Department (HOSPITAL_COMMUNITY)
Admission: EM | Admit: 2015-05-15 | Discharge: 2015-05-15 | Disposition: A | Discharge status: Home / Self Care | Payer: Medicare HMO | Attending: Emergency Medicine | Admitting: Emergency Medicine

## 2015-05-15 ENCOUNTER — Encounter (HOSPITAL_COMMUNITY): Payer: Self-pay | Admitting: *Deleted

## 2015-05-15 DIAGNOSIS — I10 Essential (primary) hypertension: Secondary | ICD-10-CM | POA: Diagnosis not present

## 2015-05-15 DIAGNOSIS — Z8659 Personal history of other mental and behavioral disorders: Secondary | ICD-10-CM | POA: Diagnosis not present

## 2015-05-15 DIAGNOSIS — Z720 Tobacco use: Secondary | ICD-10-CM | POA: Diagnosis not present

## 2015-05-15 DIAGNOSIS — Z79899 Other long term (current) drug therapy: Secondary | ICD-10-CM | POA: Insufficient documentation

## 2015-05-15 DIAGNOSIS — M79672 Pain in left foot: Secondary | ICD-10-CM | POA: Diagnosis present

## 2015-05-15 DIAGNOSIS — M722 Plantar fascial fibromatosis: Secondary | ICD-10-CM

## 2015-05-15 MED ORDER — NAPROXEN 500 MG PO TABS: 500.0000 mg | ORAL_TABLET | Freq: Two times a day (BID) | ORAL | Status: DC | PRN

## 2015-05-15 MED ORDER — NAPROXEN 500 MG PO TABS
500.0000 mg | ORAL_TABLET | Freq: Once | ORAL | Status: AC
  Administered 2015-05-15: 500 mg via ORAL
  Filled 2015-05-15: qty 1

## 2015-05-15 NOTE — ED Notes (Addendum)
Pt reports sole of foot/heel pain x4 days, denies trauma or injury. Pain 8/10. Ambulatory.    Pt taken to fast track room, asked nurse if she would get anything for pain. I explained I was unsure, that was up to the provider.

## 2015-05-15 NOTE — Discharge Instructions (Signed)
Use supportive shoes when walking. Use a frozen ice bottle to stretch your foot in the mornings. Use tylenol or naprosyn as needed for pain. [facility] for ongoing management. Return to the ER for changes or worsening symptoms.   Plantar Fasciitis Plantar fasciitis is a common condition that causes foot pain. It is soreness (inflammation) of the band of tough fibrous tissue on the bottom of the foot that runs from the heel bone (calcaneus) to the ball of the foot. The cause of this soreness may be from excessive standing, poor fitting shoes, running on hard surfaces, being overweight, having an abnormal walk, or overuse (this is common in runners) of the painful foot or feet. It is also common in aerobic exercise dancers and ballet dancers. SYMPTOMS  Most people with plantar fasciitis complain of:  Severe pain in the morning on the bottom of their foot especially when taking the first steps out of bed. This pain recedes after a few minutes of walking.  Severe pain is experienced also during walking following a long period of inactivity.  Pain is worse when walking barefoot or up stairs DIAGNOSIS   Your caregiver will diagnose this condition by examining and feeling your foot.  Special tests such as X-rays of your foot, are usually not needed. PREVENTION   Consult a sports medicine professional before beginning a new exercise program.  Walking programs offer a good workout. With walking there is a lower chance of overuse injuries common to runners. There is less impact and less jarring of the joints.  Begin all new exercise programs slowly. If problems or pain develop, decrease the amount of time or distance until you are at a comfortable level.  Wear good shoes and replace them regularly.  Stretch your foot and the heel cords at the back of the ankle (Achilles tendon) both before and after exercise.  Run or exercise on even surfaces that are not hard.  For example, asphalt is better than pavement.  Do not run barefoot on hard surfaces.  If using a treadmill, vary the incline.  Do not continue to workout if you have foot or joint problems. Seek professional help if they do not improve. HOME CARE INSTRUCTIONS   Avoid activities that cause you pain until you recover.  Use ice or cold packs on the problem or painful areas after working out.  Only take over-the-counter or prescription medicines for pain, discomfort, or fever as directed by your caregiver.  Soft shoe inserts or athletic shoes with air or gel sole cushions may be helpful.  If problems continue or become more severe, consult a sports medicine caregiver or your own health care provider. Cortisone is a potent anti-inflammatory medication that may be injected into the painful area. You can discuss this treatment with your caregiver. MAKE SURE YOU:   Understand these instructions.  Will watch your condition.  Will get help right away if you are not doing well or get worse. Document Released: 07/13/2001 Document Revised: 01/10/2012 Document Reviewed: 09/11/2008 [facility] Patient Information 2015 [place]. This information is not intended to replace advice given to you by your health care provider. Make sure you discuss any questions you have with your health care provider.  Cryotherapy Cryotherapy is when you put ice on your injury. Ice helps lessen pain and puffiness (swelling) after an injury. Ice works the best when you start using it in the first 24 to 48 hours after an injury. HOME  CARE  Put a dry or damp towel between the ice pack and your skin.  You may press gently on the ice pack.  Leave the ice on for no more than 10 to 20 minutes at a time.  Check your skin after 5 minutes to make sure your skin is okay.  Rest at least 20 minutes between ice pack uses.  Stop using ice when your skin loses feeling (numbness).  Do not use ice on someone who cannot tell  you when it hurts. This includes small children and people with memory problems (dementia). GET HELP RIGHT AWAY IF:  You have white spots on your skin.  Your skin turns Dorin or pale.  Your skin feels waxy or hard.  Your puffiness gets worse. MAKE SURE YOU:   Understand these instructions.  Will watch your condition.  Will get help right away if you are not doing well or get worse. Document Released: 04/05/2008 Document Revised: 01/10/2012 Document Reviewed: 06/10/2011 [facility] Patient Information 2015 [place], [place]. This information is not intended to replace advice given to you by your health care provider. Make sure you discuss any questions you have with your health care provider.

## 2015-05-15 NOTE — ED Provider Notes (Signed)
CSN: [id number]     Arrival date & time 05/15/15  1136 History   First MD Initiated Contact with Patient 05/15/15 1148     Chief Complaint  Patient presents with  . Foot Pain     (Consider location/radiation/quality/duration/timing/severity/associated sxs/prior Treatment) HPI Comments: Susan Bryan is a 44 y.o. female with a PMHx of HTN, bipolar 1 disorder, and heroin/polysubstance abuse, who presents to the ED with complaints of 4 days of 9/10 constant left heel pain which is throbbing and aching nonradiating worse with ambulation and pressure to the area, and unrelieved with Tylenol, ibuprofen, ice, and heat. She states that she has been more active and walking recently, wearing sandals. Denies any trauma. Denies any bruising, swelling, erythema, warmth, fevers, chills, chest pain, shortness of breath, abdominal pain, nausea, vomiting, numbness, tingling, or weakness. No prior injury to this foot.   Requesting narcotics immediately upon beginning exam.  Patient is a 44 y.o. female presenting with lower extremity pain. The history is provided by the patient. No language interpreter was used.  Foot Pain This is a new problem. The current episode started in the past 7 days. The problem occurs constantly. The problem has been unchanged. Associated symptoms include arthralgias (L foot/heel). Pertinent negatives include no abdominal pain, chest pain, chills, fever, myalgias, nausea, numbness, vomiting or weakness. The symptoms are aggravated by walking. She has tried NSAIDs, acetaminophen, ice and heat for the symptoms. The treatment provided no relief.    Past Medical History  Diagnosis Date  . Hypertension   . Bipolar 1 disorder    Past Surgical History  Procedure Laterality Date  . Vascular surgery    . Appendectomy    . Abdominal hysterectomy     History reviewed. No pertinent family history. History  Substance Use Topics  . Smoking status: Current Every Day Smoker -- 1.00  packs/day for 20 years    Types: Cigarettes  . Smokeless tobacco: Not on file  . Alcohol Use: Yes   OB History    No data available     Review of Systems  Constitutional: Negative for fever and chills.  Respiratory: Negative for shortness of breath.   Cardiovascular: Negative for chest pain.  Gastrointestinal: Negative for nausea, vomiting, abdominal pain and diarrhea.  Musculoskeletal: Positive for arthralgias (L foot/heel). Negative for myalgias.  Skin: Negative for color change.  Allergic/Immunologic: Negative for immunocompromised state.  Neurological: Negative for weakness and numbness.  Psychiatric/Behavioral: Negative for confusion.   10 Systems reviewed and are negative for acute change except as noted in the HPI.    Allergies  Review of patient's allergies indicates no known allergies.  Home Medications   Prior to Admission medications   Medication Sig Start Date End Date Taking? Authorizing Provider  Aspirin-Acetaminophen (GOODYS BODY PAIN PO) Take 1 tablet by mouth 3 (three) times daily as needed (back pain).    Historical Provider, MD  ibuprofen (ADVIL,MOTRIN) 800 MG tablet Take 1 tablet (800 mg total) by mouth every 8 (eight) hours as needed for mild pain or moderate pain. 12/25/14   Trixie Dredge, PA-C  lisinopril (PRINIVIL,ZESTRIL) 10 MG tablet Take 10 mg by mouth daily.    Historical Provider, MD  naproxen (NAPROSYN) 500 MG tablet Take 1 tablet (500 mg total) by mouth 2 (two) times daily. 11/04/14   Joycie Peek, PA-C  omeprazole (PRILOSEC) 20 MG capsule Take 20 mg by mouth daily.    Historical Provider, MD  oxyCODONE-acetaminophen (PERCOCET/ROXICET) 5-325 MG per tablet Take 1 tablet by  mouth every 6 (six) hours as needed for severe pain. 03/16/15   Marisa Severinlga Otter, MD  pregabalin (LYRICA) 75 MG capsule Take 75 mg by mouth 2 (two) times daily.    Historical Provider, MD   BP 154/81 mmHg  Pulse 68  Temp(Src) 97.6 F (36.4 C) (Oral)  Resp 20  SpO2 97% Physical Exam   Constitutional: She is oriented to person, place, and time. Vital signs are normal. She appears well-developed and well-nourished.  Non-toxic appearance. No distress.  Afebrile, nontoxic, NAD  HENT:  Head: Normocephalic and atraumatic.  Mouth/Throat: Mucous membranes are normal.  Eyes: Conjunctivae and EOM are normal. Right eye exhibits no discharge. Left eye exhibits no discharge.  Neck: Normal range of motion. Neck supple.  Cardiovascular: Normal rate and intact distal pulses.   Pulmonary/Chest: Effort normal. No respiratory distress.  Abdominal: Normal appearance. She exhibits no distension.  Musculoskeletal: Normal range of motion.       Left foot: There is tenderness. There is normal range of motion, no bony tenderness, no swelling, normal capillary refill, no crepitus and no deformity.  L foot with FROM intact, wiggling all digits, with tenderness at insertion site of plantar fascia and into the plantar fascia in the arch, no swelling or skin changes, cap refill brisk and present, sensation grossly intact. Achilles intact.  Neurological: She is alert and oriented to person, place, and time. She has normal strength. No sensory deficit.  Skin: Skin is warm, dry and intact. No rash noted.  Psychiatric: She has a normal mood and affect. Her behavior is normal.  Nursing note and vitals reviewed.   ED Course  Procedures (including critical care time) Labs Review Labs Reviewed - No data to display  Imaging Review No results found.   EKG Interpretation None      MDM   Final diagnoses:  Foot pain, left  Plantar fasciitis of left foot    44 y.o. female here with L foot/heel pain after increased activity recently. Tenderness over plantar fascia and at insertion site of plantar fascia, likely plantar fasciitis. Pt adamant about getting narcotics, discussed that she has a hx of narcotic abuse and this issue does not indicate narcotics as being needed. Discussed ice bottle stretches  and supportive shoes. [facility]. Doubt need for xray since no trauma or injury, and likely won't change management. Will give naprosyn here and to go home with. I explained the diagnosis and have given explicit precautions to return to the ER including for any other new or worsening symptoms. The patient understands and accepts the medical plan as it's been dictated and I have answered their questions. Discharge instructions concerning home care and prescriptions have been given. The patient is STABLE and is discharged to home in good condition.  BP 154/81 mmHg  Pulse 68  Temp(Src) 97.6 F (36.4 C) (Oral)  Resp 20  SpO2 97%  Meds ordered this encounter  Medications  . naproxen (NAPROSYN) tablet 500 mg    Sig:   . naproxen (NAPROSYN) 500 MG tablet    Sig: Take 1 tablet (500 mg total) by mouth 2 (two) times daily as needed for mild pain, moderate pain or headache (TAKE WITH MEALS.).    Dispense:  20 tablet    Refill:  0    Order Specific Question:  Supervising Provider    Answer:  Eber HongMILLER, BRIAN [[id number]]       Thurley Francesconi Camprubi-Soms, PA-C 05/15/15 1208  Tilden FossaElizabeth Rees, MD 05/15/15 1222

## 2015-05-23 ENCOUNTER — Encounter (HOSPITAL_COMMUNITY): Payer: Self-pay | Admitting: Vascular Surgery

## 2015-05-23 ENCOUNTER — Emergency Department (HOSPITAL_COMMUNITY)
Admission: EM | Admit: 2015-05-23 | Discharge: 2015-05-23 | Disposition: A | Discharge status: Home / Self Care | Payer: Medicare HMO | Attending: Emergency Medicine | Admitting: Emergency Medicine

## 2015-05-23 DIAGNOSIS — I10 Essential (primary) hypertension: Secondary | ICD-10-CM | POA: Diagnosis not present

## 2015-05-23 DIAGNOSIS — M79672 Pain in left foot: Secondary | ICD-10-CM | POA: Insufficient documentation

## 2015-05-23 DIAGNOSIS — Z79899 Other long term (current) drug therapy: Secondary | ICD-10-CM | POA: Diagnosis not present

## 2015-05-23 DIAGNOSIS — Z720 Tobacco use: Secondary | ICD-10-CM | POA: Diagnosis not present

## 2015-05-23 DIAGNOSIS — Z8659 Personal history of other mental and behavioral disorders: Secondary | ICD-10-CM | POA: Insufficient documentation

## 2015-05-23 MED ORDER — TRAMADOL HCL 50 MG PO TABS: 50.0000 mg | ORAL_TABLET | Freq: Four times a day (QID) | ORAL | Status: DC | PRN

## 2015-05-23 NOTE — ED Notes (Signed)
Pt reports to the ED for eval of left heel and arch of her foot pain x 2 weeks. Denies any known injury. Reports pain is worse with weight bearing. CMS and full ROM intact. Has a MD appt on 8/5. Pt A&Ox4, resp e/u, and skin warm and dry.

## 2015-05-23 NOTE — ED Provider Notes (Signed)
CSN: [id number]     Arrival date & time 05/23/15  1456 History  This chart was scribed for non-physician practitioner, Garlon Hatchet, PA-C working with Doug Sou, MD by Placido Sou, ED scribe. This patient was seen in room TR06C/TR06C and the patient's care was started at 3:34 PM.  Chief Complaint  Patient presents with  . Foot Pain   The history is provided by the patient. No language interpreter was used.    HPI Comments: Susan Bryan is a 44 y.o. female, with a remote history of polysubstance/heroin abuse, who presents to the Emergency Department complaining of moderate, constant, left foot pain with onset 2 weeks ago and experiencing a recent worsening of her symptoms. She notes having come to [facility] previously for the same symptoms and being diagnosed with plantar fascitis on 05/15/2015. Since her diagnosis, she further notes that her symptoms have worsened and the pain now radiates to her lower left leg. Pt describes the pain as similar to a toothache and notes a worsening of her symptoms when bearing weight or ambulating. Pt notes taking naproxen, tylenol any Goody's Powder with no relief. She notes having an appointment with the referred specialist from her prior visit on 06/06/2015. She denies any other associated symptoms.     Past Medical History  Diagnosis Date  . Hypertension   . Bipolar 1 disorder    Past Surgical History  Procedure Laterality Date  . Vascular surgery    . Appendectomy    . Abdominal hysterectomy     No family history on file. History  Substance Use Topics  . Smoking status: Current Every Day Smoker -- 1.00 packs/day for 20 years    Types: Cigarettes  . Smokeless tobacco: Not on file  . Alcohol Use: Yes   OB History    No data available     Review of Systems  Musculoskeletal: Positive for myalgias and arthralgias.  All other systems reviewed and are negative.  Allergies  Review of patient's allergies indicates no known  allergies.  Home Medications   Prior to Admission medications   Medication Sig Start Date End Date Taking? Authorizing Provider  Aspirin-Acetaminophen (GOODYS BODY PAIN PO) Take 1 tablet by mouth 3 (three) times daily as needed (back pain).    Historical Provider, MD  gabapentin (NEURONTIN) 300 MG capsule Take 300 mg by mouth 2 (two) times daily. 04/14/15   Historical Provider, MD  ibuprofen (ADVIL,MOTRIN) 800 MG tablet Take 1 tablet (800 mg total) by mouth every 8 (eight) hours as needed for mild pain or moderate pain. Patient not taking: Reported on 05/15/2015 12/25/14   Trixie Dredge, PA-C  lisinopril (PRINIVIL,ZESTRIL) 10 MG tablet Take 10 mg by mouth daily.    Historical Provider, MD  naproxen (NAPROSYN) 500 MG tablet Take 1 tablet (500 mg total) by mouth 2 (two) times daily. Patient not taking: Reported on 05/15/2015 11/04/14   Joycie Peek, PA-C  naproxen (NAPROSYN) 500 MG tablet Take 1 tablet (500 mg total) by mouth 2 (two) times daily as needed for mild pain, moderate pain or headache (TAKE WITH MEALS.). 05/15/15   Mercedes Camprubi-Soms, PA-C  omeprazole (PRILOSEC) 20 MG capsule Take 20 mg by mouth daily.    Historical Provider, MD  oxyCODONE-acetaminophen (PERCOCET/ROXICET) 5-325 MG per tablet Take 1 tablet by mouth every 6 (six) hours as needed for severe pain. Patient not taking: Reported on 05/15/2015 03/16/15   Marisa Severin, MD  traZODone (DESYREL) 100 MG tablet Take 100 mg by mouth  at bedtime. 02/26/15   Historical Provider, MD   BP 123/87 mmHg  Pulse 81  Temp(Src) 98.1 F (36.7 C) (Oral)  Resp 18  SpO2 99%   Physical Exam  Constitutional: She is oriented to person, place, and time. She appears well-developed and well-nourished.  HENT:  Head: Normocephalic and atraumatic.  Mouth/Throat: Oropharynx is clear and moist.  Eyes: Conjunctivae and EOM are normal. Pupils are equal, round, and reactive to light.  Neck: Normal range of motion.  Cardiovascular: Normal rate, regular rhythm  and normal heart sounds.   Pulmonary/Chest: Effort normal and breath sounds normal.  Abdominal: Soft. Bowel sounds are normal.  Musculoskeletal: Normal range of motion.  Tenderness of left heel and sole of foot at insertion site of plantar fascia; no acute bony deformities; full ROM of foot maintained; foot is NVI; no overlying skin changes or signs of infection  Neurological: She is alert and oriented to person, place, and time.  Skin: Skin is warm and dry.  Psychiatric: She has a normal mood and affect.  Nursing note and vitals reviewed.  ED Course  Procedures  DIAGNOSTIC STUDIES: Oxygen Saturation is 97% on RA, normal by my interpretation.    COORDINATION OF CARE: 3:38 PM Discussed treatment plan with pt at bedside and pt agreed to plan.  Labs Review Labs Reviewed - No data to display  Imaging Review No results found.   EKG Interpretation None      MDM   Final diagnoses:  Left foot pain   44 year old female with left foot pain with left foot pain. She was seen last week for the same and discharged home with supportive care. She states she has been applying ice and take the anti-inflammatories without relief. Her exam findings are consistent with plantar fasciitis. No signs of infection or bony deformities.  Do not feel imaging indicated at this time.  She has follow-up with foot care center on 06/08/2014-- encouraged to keep appt.  Discussed plan with patient, he/she acknowledged understanding and agreed with plan of care.  Return precautions given for new or worsening symptoms.  I personally performed the services described in this documentation, which was scribed in my presence. The recorded information has been reviewed and is accurate.  Garlon Hatchet, PA-C 05/23/15 1601  Doug Sou, MD 05/23/15 [phone]

## 2015-05-23 NOTE — Discharge Instructions (Signed)
Take the prescribed medication as directed. Follow-up with foot specialist. Return to the ED for new or worsening symptoms.

## 2015-06-06 ENCOUNTER — Encounter: Payer: Medicare HMO | Admitting: Podiatry

## 2015-06-16 ENCOUNTER — Emergency Department (HOSPITAL_COMMUNITY)
Admission: EM | Admit: 2015-06-16 | Discharge: 2015-06-16 | Disposition: A | Discharge status: Home / Self Care | Payer: Medicare HMO | Attending: Emergency Medicine | Admitting: Emergency Medicine

## 2015-06-16 ENCOUNTER — Encounter (HOSPITAL_COMMUNITY): Payer: Self-pay | Admitting: *Deleted

## 2015-06-16 DIAGNOSIS — R21 Rash and other nonspecific skin eruption: Secondary | ICD-10-CM | POA: Insufficient documentation

## 2015-06-16 DIAGNOSIS — Z7982 Long term (current) use of aspirin: Secondary | ICD-10-CM | POA: Insufficient documentation

## 2015-06-16 DIAGNOSIS — I10 Essential (primary) hypertension: Secondary | ICD-10-CM | POA: Insufficient documentation

## 2015-06-16 DIAGNOSIS — Z720 Tobacco use: Secondary | ICD-10-CM | POA: Insufficient documentation

## 2015-06-16 DIAGNOSIS — F319 Bipolar disorder, unspecified: Secondary | ICD-10-CM | POA: Diagnosis not present

## 2015-06-16 DIAGNOSIS — Z79899 Other long term (current) drug therapy: Secondary | ICD-10-CM | POA: Diagnosis not present

## 2015-06-16 MED ORDER — DEXAMETHASONE SODIUM PHOSPHATE 10 MG/ML IJ SOLN
10.0000 mg | Freq: Once | INTRAMUSCULAR | Status: AC
  Administered 2015-06-16: 10 mg via INTRAMUSCULAR
  Filled 2015-06-16: qty 1

## 2015-06-16 MED ORDER — PREDNISONE 20 MG PO TABS: ORAL_TABLET | ORAL | Status: DC

## 2015-06-16 MED ORDER — HYDROXYZINE HCL 25 MG PO TABS: 25.0000 mg | ORAL_TABLET | Freq: Four times a day (QID) | ORAL | Status: DC

## 2015-06-16 NOTE — ED Notes (Addendum)
Pt reports rash stared on SAT. Pt presents with rash face,chest arms and facial swelling. Pt took Benadryl OTC with out relief. Pt speaking in complete sentences.

## 2015-06-16 NOTE — ED Provider Notes (Signed)
CSN: [id number]     Arrival date & time 06/16/15  1024 History  This chart was scribed for non-physician practitioner, Arthor Captain, PA-C working with Azalia Bilis, MD, by Jarvis Morgan, ED Scribe. This patient was seen in room TR08C/TR08C and the patient's care was started at 10:31 AM.    Chief Complaint  Patient presents with  . Rash  . Allergic Reaction    The history is provided by the patient. No language interpreter was used.    HPI Comments: Susan Bryan is a 44 y.o. female who presents to the Emergency Department complaining of gradually spreading, mild, red, itchy, rash onset 2 days ago. She presents with a rash to her face, chest, and bilateral arms. She reports associated facial swelling. She took Benadryl OTC with no relief. She states she was in the sun all day Saturday and outside trimming hedges. She denies any trouble breathing, throat swelling, tongue swelling, voice changes, fever or chills.   Past Medical History  Diagnosis Date  . Hypertension   . Bipolar 1 disorder    Past Surgical History  Procedure Laterality Date  . Vascular surgery    . Appendectomy    . Abdominal hysterectomy     History reviewed. No pertinent family history. Social History  Substance Use Topics  . Smoking status: Current Every Day Smoker -- 1.00 packs/day for 20 years    Types: Cigarettes  . Smokeless tobacco: Never Used  . Alcohol Use: Yes   OB History    No data available     Review of Systems  Constitutional: Negative for fever and chills.  HENT: Positive for facial swelling. Negative for trouble swallowing and voice change.   Respiratory: Negative for shortness of breath.   Skin: Positive for rash.      Allergies  Review of patient's allergies indicates no known allergies.  Home Medications   Prior to Admission medications   Medication Sig Start Date End Date Taking? Authorizing Provider  Aspirin-Acetaminophen (GOODYS BODY PAIN PO) Take 1 tablet by mouth 3  (three) times daily as needed (back pain).    Historical Provider, MD  gabapentin (NEURONTIN) 300 MG capsule Take 300 mg by mouth 2 (two) times daily. 04/14/15   Historical Provider, MD  hydrOXYzine (ATARAX/VISTARIL) 25 MG tablet Take 1 tablet (25 mg total) by mouth every 6 (six) hours. 06/16/15   Arthor Captain, PA-C  ibuprofen (ADVIL,MOTRIN) 800 MG tablet Take 1 tablet (800 mg total) by mouth every 8 (eight) hours as needed for mild pain or moderate pain. Patient not taking: Reported on 05/15/2015 12/25/14   Trixie Dredge, PA-C  lisinopril (PRINIVIL,ZESTRIL) 10 MG tablet Take 10 mg by mouth daily.    Historical Provider, MD  naproxen (NAPROSYN) 500 MG tablet Take 1 tablet (500 mg total) by mouth 2 (two) times daily. Patient not taking: Reported on 05/15/2015 11/04/14   Joycie Peek, PA-C  naproxen (NAPROSYN) 500 MG tablet Take 1 tablet (500 mg total) by mouth 2 (two) times daily as needed for mild pain, moderate pain or headache (TAKE WITH MEALS.). 05/15/15   Mercedes Camprubi-Soms, PA-C  omeprazole (PRILOSEC) 20 MG capsule Take 20 mg by mouth daily.    Historical Provider, MD  oxyCODONE-acetaminophen (PERCOCET/ROXICET) 5-325 MG per tablet Take 1 tablet by mouth every 6 (six) hours as needed for severe pain. Patient not taking: Reported on 05/15/2015 03/16/15   Marisa Severin, MD  predniSONE (DELTASONE) 20 MG tablet 3 tabs po daily x 3 days, then 2 tabs x  3 days, then 1.5 tabs x 3 days, then 1 tab x 3 days, then 0.5 tabs x 3 days 06/16/15   Arthor Captain, PA-C  traMADol (ULTRAM) 50 MG tablet Take 1 tablet (50 mg total) by mouth every 6 (six) hours as needed. 05/23/15   Garlon Hatchet, PA-C  traZODone (DESYREL) 100 MG tablet Take 100 mg by mouth at bedtime. 02/26/15   Historical Provider, MD   Triage Vitals: BP 123/74 mmHg  Pulse 63  Resp 16  Ht  (1.753 m)  Wt 185 lb (83.915 kg)  BMI 27.31 kg/m2  SpO2 99%  Physical Exam  Constitutional: She is oriented to person, place, and time. She appears  well-developed and well-nourished. No distress.  HENT:  Head: Normocephalic and atraumatic.  Eyes: Conjunctivae and EOM are normal.  Neck: Neck supple. No tracheal deviation present.  Cardiovascular: Normal rate.   Pulmonary/Chest: Effort normal. No respiratory distress.  Musculoskeletal: Normal range of motion.  Neurological: She is alert and oriented to person, place, and time.  Skin: Skin is warm and dry. Rash noted. There is erythema.  Confluent erythema of the anterior neck and chest wall, facial swelling without lip , oral involvement. Multiple wheels along arm with excoriation and confluent erythema in the bilateral axillae   Psychiatric: She has a normal mood and affect. Her behavior is normal.  Nursing note and vitals reviewed.   ED Course  Procedures (including critical care time)  DIAGNOSTIC STUDIES: Oxygen Saturation is 99% on RA, normal by my interpretation.    COORDINATION OF CARE:    Labs Review Labs Reviewed - No data to display  Imaging Review No results found.     EKG Interpretation None      MDM   Final diagnoses:  Rash    Patient with rash of the neck, face, axilla ache, arms. Differential includes bug bites, contact dermatitis and solar urticaria. Patient does admit to being in the sun. Weight involvement. Patient will be given IM Decadron, 2 week prednisone taper and hydroxyzine. Discussed reasons to seek immediate medical care. She appears safe for discharge at this time. I personally performed the services described in this documentation, which was scribed in my presence. The recorded information has been reviewed and is accurate.       Arthor Captain, PA-C 06/16/15 1050  Azalia Bilis, MD 06/16/15 1210

## 2015-06-16 NOTE — ED Notes (Signed)
Declined W/C at D/C and was escorted to lobby by RN. 

## 2015-06-16 NOTE — Discharge Instructions (Signed)

## 2015-06-19 ENCOUNTER — Ambulatory Visit: Payer: Medicare HMO | Admitting: Podiatry

## 2015-07-02 NOTE — Progress Notes (Signed)
This encounter was created in error - please disregard.

## 2015-07-09 ENCOUNTER — Encounter: Payer: Medicare HMO | Admitting: Podiatry

## 2015-08-01 NOTE — Progress Notes (Signed)
This encounter was created in error - please disregard.

## 2015-08-02 DIAGNOSIS — J189 Pneumonia, unspecified organism: Secondary | ICD-10-CM

## 2015-08-02 HISTORY — DX: Pneumonia, unspecified organism: J18.9

## 2015-08-11 ENCOUNTER — Emergency Department (HOSPITAL_COMMUNITY): Payer: Medicare HMO

## 2015-08-11 ENCOUNTER — Encounter (HOSPITAL_COMMUNITY): Payer: Self-pay | Admitting: Emergency Medicine

## 2015-08-11 ENCOUNTER — Emergency Department (HOSPITAL_COMMUNITY)
Admission: EM | Admit: 2015-08-11 | Discharge: 2015-08-11 | Disposition: A | Discharge status: Home / Self Care | Payer: Medicare HMO | Attending: Emergency Medicine | Admitting: Emergency Medicine

## 2015-08-11 DIAGNOSIS — Z720 Tobacco use: Secondary | ICD-10-CM | POA: Diagnosis not present

## 2015-08-11 DIAGNOSIS — J45901 Unspecified asthma with (acute) exacerbation: Secondary | ICD-10-CM | POA: Insufficient documentation

## 2015-08-11 DIAGNOSIS — Z7982 Long term (current) use of aspirin: Secondary | ICD-10-CM | POA: Insufficient documentation

## 2015-08-11 DIAGNOSIS — I10 Essential (primary) hypertension: Secondary | ICD-10-CM | POA: Insufficient documentation

## 2015-08-11 DIAGNOSIS — Z79899 Other long term (current) drug therapy: Secondary | ICD-10-CM | POA: Diagnosis not present

## 2015-08-11 DIAGNOSIS — R0602 Shortness of breath: Secondary | ICD-10-CM | POA: Diagnosis present

## 2015-08-11 DIAGNOSIS — F319 Bipolar disorder, unspecified: Secondary | ICD-10-CM | POA: Insufficient documentation

## 2015-08-11 DIAGNOSIS — J069 Acute upper respiratory infection, unspecified: Secondary | ICD-10-CM | POA: Diagnosis not present

## 2015-08-11 MED ORDER — ALBUTEROL SULFATE (2.5 MG/3ML) 0.083% IN NEBU: 2.5000 mg | INHALATION_SOLUTION | Freq: Four times a day (QID) | RESPIRATORY_TRACT | Status: AC | PRN

## 2015-08-11 MED ORDER — IPRATROPIUM-ALBUTEROL 0.5-2.5 (3) MG/3ML IN SOLN
3.0000 mL | Freq: Once | RESPIRATORY_TRACT | Status: AC
  Administered 2015-08-11: 3 mL via RESPIRATORY_TRACT
  Filled 2015-08-11: qty 3

## 2015-08-11 MED ORDER — ALBUTEROL SULFATE HFA 108 (90 BASE) MCG/ACT IN AERS
1.0000 | INHALATION_SPRAY | Freq: Once | RESPIRATORY_TRACT | Status: AC
  Administered 2015-08-11: 2 via RESPIRATORY_TRACT
  Filled 2015-08-11: qty 6.7

## 2015-08-11 MED ORDER — IBUPROFEN 400 MG PO TABS
600.0000 mg | ORAL_TABLET | Freq: Once | ORAL | Status: AC
  Administered 2015-08-11: 600 mg via ORAL
  Filled 2015-08-11 (×2): qty 1

## 2015-08-11 MED ORDER — SODIUM CHLORIDE 0.9 % IV BOLUS (SEPSIS)
1000.0000 mL | Freq: Once | INTRAVENOUS | Status: AC
  Administered 2015-08-11: 1000 mL via INTRAVENOUS

## 2015-08-11 MED ORDER — ALBUTEROL (5 MG/ML) CONTINUOUS INHALATION SOLN
10.0000 mg/h | INHALATION_SOLUTION | Freq: Once | RESPIRATORY_TRACT | Status: AC
  Administered 2015-08-11: 10 mg/h via RESPIRATORY_TRACT
  Filled 2015-08-11: qty 20

## 2015-08-11 MED ORDER — METHYLPREDNISOLONE SODIUM SUCC 125 MG IJ SOLR
125.0000 mg | Freq: Once | INTRAMUSCULAR | Status: AC
  Administered 2015-08-11: 125 mg via INTRAMUSCULAR
  Filled 2015-08-11: qty 2

## 2015-08-11 NOTE — Progress Notes (Signed)
Peak flow x 3 1st 300 l/m 2nd 350 l/m 3rd 250 l/m  Good effort by PT

## 2015-08-11 NOTE — ED Notes (Signed)
During ambulation pt sp02 remained around 96-97% continuously.

## 2015-08-11 NOTE — ED Notes (Signed)
Pt transported to radiology.

## 2015-08-11 NOTE — ED Notes (Signed)
Patient complains of chest congestion and nasal congestion that started on Saturday.  Patient states she has been having wheezing with the chest congestion.  Patient states she has been coughing up green and has been blowing green since Saturday.

## 2015-08-11 NOTE — Discharge Instructions (Signed)
Asthma, Acute Bronchospasm °Acute bronchospasm caused by asthma is also referred to as an asthma attack. Bronchospasm means your air passages become narrowed. The narrowing is caused by inflammation and tightening of the muscles in the air tubes (bronchi) in your lungs. This can make it hard to breathe or cause you to wheeze and cough. °CAUSES °Possible triggers are: °· Animal dander from the skin, hair, or feathers of animals. °· Dust mites contained in house dust. °· Cockroaches. °· Pollen from trees or grass. °· Mold. °· Cigarette or tobacco smoke. °· Air pollutants such as dust, household cleaners, hair sprays, aerosol sprays, paint fumes, strong chemicals, or strong odors. °· Cold air or weather changes. Cold air may trigger inflammation. Winds increase molds and pollens in the air. °· Strong emotions such as crying or laughing hard. °· Stress. °· Certain medicines such as aspirin or beta-blockers. °· Sulfites in foods and drinks, such as dried fruits and wine. °· Infections or inflammatory conditions, such as a flu, cold, or inflammation of the nasal membranes (rhinitis). °· Gastroesophageal reflux disease (GERD). GERD is a condition where stomach acid backs up into your esophagus. °· Exercise or strenuous activity. °SIGNS AND SYMPTOMS  °· Wheezing. °· Excessive coughing, particularly at night. °· Chest tightness. °· Shortness of breath. °DIAGNOSIS  °Your health care provider will ask you about your medical history and perform a physical exam. A chest X-ray or blood testing may be performed to look for other causes of your symptoms or other conditions that may have triggered your asthma attack.  °TREATMENT  °Treatment is aimed at reducing inflammation and opening up the airways in your lungs.  Most asthma attacks are treated with inhaled medicines. These include quick relief or rescue medicines (such as bronchodilators) and controller medicines (such as inhaled corticosteroids). These medicines are sometimes  given through an inhaler or a nebulizer. Systemic steroid medicine taken by mouth or given through an IV tube also can be used to reduce the inflammation when an attack is moderate or severe. Antibiotic medicines are only used if a bacterial infection is present.  °HOME CARE INSTRUCTIONS  °· Rest. °· Drink plenty of liquids. This helps the mucus to remain thin and be easily coughed up. Only use caffeine in moderation and do not use alcohol until you have recovered from your illness. °· Do not smoke. Avoid being exposed to secondhand smoke. °· You play a critical role in keeping yourself in good health. Avoid exposure to things that cause you to wheeze or to have breathing problems. °· Keep your medicines up-to-date and available. Carefully follow your health care provider's treatment plan. °· Take your medicine exactly as prescribed. °· When pollen or pollution is bad, keep windows closed and use an air conditioner or go to places with air conditioning. °· Asthma requires careful medical care. See your health care provider for a follow-up as advised. If you are more than [redacted] weeks pregnant and you were prescribed any new medicines, let your obstetrician know about the visit and how you are doing. Follow up with your health care provider as directed. °· After you have recovered from your asthma attack, make an appointment with your outpatient doctor to talk about ways to reduce the likelihood of future attacks. If you do not have a doctor who manages your asthma, make an appointment with a primary care doctor to discuss your asthma. °SEEK IMMEDIATE MEDICAL CARE IF:  °· You are getting worse. °· You have trouble breathing. If severe, call your local   emergency services (911 in the [place]  You develop chest pain or discomfort.  You are vomiting.  You are not able to keep fluids down.  You are coughing up yellow, green, brown, or bloody sputum.  You have a fever and your symptoms suddenly get worse.  You have  trouble swallowing. MAKE SURE YOU:   Understand these instructions.  Will watch your condition.  Will get help right away if you are not doing well or get worse.   This information is not intended to replace advice given to you by your health care provider. Make sure you discuss any questions you have with your health care provider.   Document Released: 02/02/2007 Document Revised: 10/23/2013 Document Reviewed: 04/25/2013 [employer] Interactive Patient Education [employer]   Please use medication as directed, please return to the emergency room immediately if new or worsening signs or symptoms present. Please contact her primary care provider scheduled follow-up evaluation in 2 days.

## 2015-08-11 NOTE — ED Provider Notes (Signed)
CSN: [id number]     Arrival date & time 08/11/15  4098 History   First MD Initiated Contact with Patient 08/11/15 1005     Chief Complaint  Patient presents with  . URI   HPI   44 year old female presents today with cough, rhinorrhea, nasal congestion, and shortness of breath. Patient reports symptoms started on Saturday  ( 2 days ago) with the upper respiratory symptoms and cough. Patient reports that she's been using Tylenol Cold and flu,  and rest. Patient reports that she has a history of asthma, but does not have her nebulizer machine or current metered-dose inhaler. Patient reports a pneumonia approximately 2 years ago. Patient reports that she has shortness of breath, she describes this as "congested", she denies chest pain, abdominal pain, lower extremity swelling or edema. Patient is currently a smoker, does not take birth control, no history of active malignancy, recent trauma, prolonged immobilization, or history of DVT PE.  Patient has no previous history of asthma exacerbation resulting in hospitalization, intubation.    Past Medical History  Diagnosis Date  . Hypertension   . Bipolar 1 disorder [facility])    Past Surgical History  Procedure Laterality Date  . Vascular surgery    . Appendectomy    . Abdominal hysterectomy     No family history on file. Social History  Substance Use Topics  . Smoking status: Current Every Day Smoker -- 1.00 packs/day for 20 years    Types: Cigarettes  . Smokeless tobacco: Never Used  . Alcohol Use: Yes     Comment: socially   OB History    No data available     Review of Systems  All other systems reviewed and are negative.   Allergies  Review of patient's allergies indicates no known allergies.  Home Medications   Prior to Admission medications   Medication Sig Start Date End Date Taking? Authorizing Provider  Aspirin-Acetaminophen (GOODYS BODY PAIN PO) Take 1 tablet by mouth 3 (three) times daily as needed (back pain).   Yes  Historical Provider, MD  gabapentin (NEURONTIN) 300 MG capsule Take 300 mg by mouth 2 (two) times daily. 04/14/15  Yes Historical Provider, MD  lisinopril (PRINIVIL,ZESTRIL) 10 MG tablet Take 10 mg by mouth daily.   Yes Historical Provider, MD  omeprazole (PRILOSEC) 20 MG capsule Take 20 mg by mouth daily.   Yes Historical Provider, MD  traZODone (DESYREL) 100 MG tablet Take 100 mg by mouth at bedtime. 02/26/15  Yes Historical Provider, MD  albuterol (PROVENTIL) (2.5 MG/3ML) 0.083% nebulizer solution Take 3 mLs (2.5 mg total) by nebulization every 6 (six) hours as needed for wheezing or shortness of breath. 08/11/15   Eyvonne Mechanic, PA-C  hydrOXYzine (ATARAX/VISTARIL) 25 MG tablet Take 1 tablet (25 mg total) by mouth every 6 (six) hours. Patient not taking: Reported on 08/11/2015 06/16/15   Arthor Captain, PA-C  ibuprofen (ADVIL,MOTRIN) 800 MG tablet Take 1 tablet (800 mg total) by mouth every 8 (eight) hours as needed for mild pain or moderate pain. Patient not taking: Reported on 05/15/2015 12/25/14   Trixie Dredge, PA-C  naproxen (NAPROSYN) 500 MG tablet Take 1 tablet (500 mg total) by mouth 2 (two) times daily. Patient not taking: Reported on 05/15/2015 11/04/14   Joycie Peek, PA-C  naproxen (NAPROSYN) 500 MG tablet Take 1 tablet (500 mg total) by mouth 2 (two) times daily as needed for mild pain, moderate pain or headache (TAKE WITH MEALS.). Patient not taking: Reported on 08/11/2015 05/15/15  Mercedes Camprubi-Soms, PA-C  oxyCODONE-acetaminophen (PERCOCET/ROXICET) 5-325 MG per tablet Take 1 tablet by mouth every 6 (six) hours as needed for severe pain. Patient not taking: Reported on 05/15/2015 03/16/15   Marisa Severin, MD  predniSONE (DELTASONE) 20 MG tablet 3 tabs po daily x 3 days, then 2 tabs x 3 days, then 1.5 tabs x 3 days, then 1 tab x 3 days, then 0.5 tabs x 3 days Patient not taking: Reported on 08/11/2015 06/16/15   Arthor Captain, PA-C  traMADol (ULTRAM) 50 MG tablet Take 1 tablet (50 mg  total) by mouth every 6 (six) hours as needed. Patient not taking: Reported on 08/11/2015 05/23/15   Garlon Hatchet, PA-C   BP 132/59 mmHg  Pulse 85  Temp(Src) 99.1 F (37.3 C) (Oral)  Resp 15  Ht  (1.753 m)  Wt 190 lb (86.183 kg)  BMI 28.05 kg/m2  SpO2 92%    Physical Exam  Constitutional: She is oriented to person, place, and time. She appears well-developed and well-nourished.  HENT:  Head: Normocephalic and atraumatic.  Eyes: Conjunctivae are normal. Pupils are equal, round, and reactive to light. Right eye exhibits no discharge. Left eye exhibits no discharge. No scleral icterus.  Neck: Normal range of motion. No JVD present. No tracheal deviation present.  Cardiovascular: Normal rate, regular rhythm, normal heart sounds and intact distal pulses.  Exam reveals no gallop.   No murmur heard. Pulmonary/Chest: Effort normal. No stridor. No respiratory distress. She has wheezes. She has no rales. She exhibits no tenderness.  Abdominal: Soft. She exhibits no distension and no mass. There is no tenderness. There is no rebound and no guarding.  Musculoskeletal: Normal range of motion. She exhibits no edema or tenderness.  No lower extremity swelling or edema  Neurological: She is alert and oriented to person, place, and time. Coordination normal.  Skin: Skin is warm and dry. No rash noted. No erythema. No pallor.  Psychiatric: She has a normal mood and affect. Her behavior is normal. Judgment and thought content normal.  Nursing note and vitals reviewed.   ED Course  Procedures (including critical care time) Labs Review Labs Reviewed - No data to display  Imaging Review Dg Chest 2 View  08/11/2015   CLINICAL DATA:  Cough  EXAM: CHEST  2 VIEW  COMPARISON:  06/12/2014 chest rate  FINDINGS: Stable cardiomediastinal silhouette with normal heart size. No pneumothorax. No pleural effusion. Clear lungs, with no focal lung consolidation and no pulmonary edema.  IMPRESSION: No active  cardiopulmonary disease.   Electronically Signed   By: Delbert Phenix M.D.   On: 08/11/2015 12:17   I have personally reviewed and evaluated these images and lab results as part of my medical decision-making.   EKG Interpretation None      MDM   Final diagnoses:  URI (upper respiratory infection)  Asthma exacerbation    Labs:   Imaging: DG chest- no active cardiopulmonary disease   Consults:   Therapeutics: NS, Duoneb , Solu-Medrol.  Discharge Meds:  Meter dose inhaler, albuterol nebulizer solution, nebulizer machine.  Reassessment: After receiving a DuoNeb patient had improvement wheeze, repeat continuous neb showed again improvement, patient still had minor wheeze bilaterally lower lobes.  Assessment/Plan: Patient presentation most likely asthma exacerbation due to upper respiratory infection. Chest x-ray shows no signs of pulmonary edema or consolidation. Patient has upper respiratory symptoms including rhinorrhea, congestion, cough, likely viral infection, highly unlikely to be wheezing due to PE as she is a [place] 0  with another source. Patient was treated here in the ED with DuoNeb with some improvement, followed with continuous neb which again showed only minor  in her wheezing. Patient was not hypoxic, in no acute distress during her stay in the ED. Pt was ambualted with 02 monitor with no hypoxemia. Care options were discussed with the patient who verbalized that she would like to go home and manage this as there, she reported that she would return if new or worsening signs or symptoms presented. Patient will be given a prescription for nebulized albuterol, metered dose inhaler, nebulized machine, and follow up closely with her primary care provider in the next 2 days for reevaluation. Patient was given strict return precautions, she verbalized understanding and agreement for today's plan and had no further questions or concerns at the time discharge. Patient's partner was present  during the evaluation, she agreed to stay with the patient and return immediately if new or worsening signs or symptoms presented.          Eyvonne Mechanic, PA-C 08/12/15 1142  Mirian Mo, MD 08/16/15 1800

## 2015-08-16 ENCOUNTER — Observation Stay (HOSPITAL_COMMUNITY)
Admission: EM | Admit: 2015-08-16 | Discharge: 2015-08-20 | Disposition: A | Discharge status: Home / Self Care | Payer: Medicare HMO | Attending: Student in an Organized Health Care Education/Training Program | Admitting: Student in an Organized Health Care Education/Training Program

## 2015-08-16 ENCOUNTER — Emergency Department (HOSPITAL_COMMUNITY): Payer: Medicare HMO

## 2015-08-16 ENCOUNTER — Encounter (HOSPITAL_COMMUNITY): Payer: Self-pay | Admitting: Emergency Medicine

## 2015-08-16 DIAGNOSIS — F319 Bipolar disorder, unspecified: Secondary | ICD-10-CM | POA: Diagnosis not present

## 2015-08-16 DIAGNOSIS — I10 Essential (primary) hypertension: Secondary | ICD-10-CM | POA: Diagnosis not present

## 2015-08-16 DIAGNOSIS — B1920 Unspecified viral hepatitis C without hepatic coma: Secondary | ICD-10-CM | POA: Insufficient documentation

## 2015-08-16 DIAGNOSIS — F1199 Opioid use, unspecified with unspecified opioid-induced disorder: Secondary | ICD-10-CM | POA: Diagnosis not present

## 2015-08-16 DIAGNOSIS — J189 Pneumonia, unspecified organism: Secondary | ICD-10-CM | POA: Diagnosis not present

## 2015-08-16 DIAGNOSIS — Z8709 Personal history of other diseases of the respiratory system: Secondary | ICD-10-CM

## 2015-08-16 DIAGNOSIS — F17200 Nicotine dependence, unspecified, uncomplicated: Secondary | ICD-10-CM

## 2015-08-16 DIAGNOSIS — Z8701 Personal history of pneumonia (recurrent): Secondary | ICD-10-CM | POA: Diagnosis not present

## 2015-08-16 DIAGNOSIS — F209 Schizophrenia, unspecified: Secondary | ICD-10-CM | POA: Insufficient documentation

## 2015-08-16 DIAGNOSIS — Z87891 Personal history of nicotine dependence: Secondary | ICD-10-CM | POA: Insufficient documentation

## 2015-08-16 DIAGNOSIS — F1910 Other psychoactive substance abuse, uncomplicated: Secondary | ICD-10-CM | POA: Diagnosis present

## 2015-08-16 DIAGNOSIS — K219 Gastro-esophageal reflux disease without esophagitis: Secondary | ICD-10-CM | POA: Diagnosis not present

## 2015-08-16 DIAGNOSIS — F17210 Nicotine dependence, cigarettes, uncomplicated: Secondary | ICD-10-CM | POA: Diagnosis not present

## 2015-08-16 DIAGNOSIS — Z23 Encounter for immunization: Secondary | ICD-10-CM | POA: Diagnosis not present

## 2015-08-16 DIAGNOSIS — F1410 Cocaine abuse, uncomplicated: Secondary | ICD-10-CM | POA: Diagnosis not present

## 2015-08-16 HISTORY — DX: Pneumonia, unspecified organism: J18.9

## 2015-08-16 HISTORY — DX: Gastro-esophageal reflux disease without esophagitis: K21.9

## 2015-08-16 HISTORY — DX: Family history of other specified conditions: Z84.89

## 2015-08-16 HISTORY — DX: Other psychoactive substance abuse, uncomplicated: F19.10

## 2015-08-16 HISTORY — DX: Unspecified viral hepatitis C without hepatic coma: B19.20

## 2015-08-16 LAB — CBC WITH DIFFERENTIAL/PLATELET
BASOS PCT: 0 %
Basophils Absolute: 0 10*3/uL (ref 0.0–0.1)
EOS PCT: 2 %
Eosinophils Absolute: 0.3 10*3/uL (ref 0.0–0.7)
HEMATOCRIT: 44.1 % (ref 36.0–46.0)
Hemoglobin: 14.9 g/dL (ref 12.0–15.0)
LYMPHS ABS: 1.3 10*3/uL (ref 0.7–4.0)
Lymphocytes Relative: 8 %
MCH: 33.8 pg (ref 26.0–34.0)
MCHC: 33.8 g/dL (ref 30.0–36.0)
MCV: 100 fL (ref 78.0–100.0)
MONO ABS: 0.7 10*3/uL (ref 0.1–1.0)
MONOS PCT: 4 %
NEUTROS ABS: 14.5 10*3/uL — AB (ref 1.7–7.7)
Neutrophils Relative %: 86 %
PLATELETS: 274 10*3/uL (ref 150–400)
RBC: 4.41 MIL/uL (ref 3.87–5.11)
RDW: 13.7 % (ref 11.5–15.5)
WBC: 16.8 10*3/uL — ABNORMAL HIGH (ref 4.0–10.5)

## 2015-08-16 LAB — COMPREHENSIVE METABOLIC PANEL
ALT: 17 U/L (ref 14–54)
AST: 31 U/L (ref 15–41)
Albumin: 3.3 g/dL — ABNORMAL LOW (ref 3.5–5.0)
Alkaline Phosphatase: 79 U/L (ref 38–126)
Anion gap: 8 (ref 5–15)
BILIRUBIN TOTAL: 0.9 mg/dL (ref 0.3–1.2)
CHLORIDE: 105 mmol/L (ref 101–111)
CO2: 28 mmol/L (ref 22–32)
CREATININE: 0.63 mg/dL (ref 0.44–1.00)
Calcium: 8.9 mg/dL (ref 8.9–10.3)
Glucose, Bld: 93 mg/dL (ref 65–99)
POTASSIUM: 3.1 mmol/L — AB (ref 3.5–5.1)
Sodium: 141 mmol/L (ref 135–145)
TOTAL PROTEIN: 6.6 g/dL (ref 6.5–8.1)

## 2015-08-16 LAB — MAGNESIUM: MAGNESIUM: 1.8 mg/dL (ref 1.7–2.4)

## 2015-08-16 LAB — RAPID URINE DRUG SCREEN, HOSP PERFORMED
Amphetamines: NOT DETECTED
BARBITURATES: NOT DETECTED
Benzodiazepines: NOT DETECTED
COCAINE: NOT DETECTED
Opiates: POSITIVE — AB
TETRAHYDROCANNABINOL: POSITIVE — AB

## 2015-08-16 LAB — I-STAT CG4 LACTIC ACID, ED
LACTIC ACID, VENOUS: 1.24 mmol/L (ref 0.5–2.0)
Lactic Acid, Venous: 1.48 mmol/L (ref 0.5–2.0)

## 2015-08-16 LAB — PROCALCITONIN: PROCALCITONIN: 0.42 ng/mL

## 2015-08-16 LAB — STREP PNEUMONIAE URINARY ANTIGEN: Strep Pneumo Urinary Antigen: NEGATIVE

## 2015-08-16 MED ORDER — DEXTROSE 5 % IV SOLN
500.0000 mg | Freq: Once | INTRAVENOUS | Status: AC
  Administered 2015-08-16: 500 mg via INTRAVENOUS
  Filled 2015-08-16: qty 500

## 2015-08-16 MED ORDER — BENZONATATE 100 MG PO CAPS
100.0000 mg | ORAL_CAPSULE | Freq: Two times a day (BID) | ORAL | Status: DC | PRN
  Administered 2015-08-16 – 2015-08-19 (×4): 100 mg via ORAL
  Filled 2015-08-16 (×5): qty 1

## 2015-08-16 MED ORDER — IPRATROPIUM-ALBUTEROL 0.5-2.5 (3) MG/3ML IN SOLN
3.0000 mL | Freq: Four times a day (QID) | RESPIRATORY_TRACT | Status: DC
  Administered 2015-08-16 – 2015-08-17 (×4): 3 mL via RESPIRATORY_TRACT
  Filled 2015-08-16 (×4): qty 3

## 2015-08-16 MED ORDER — METHADONE HCL 5 MG PO TABS: 120.0000 mg | ORAL_TABLET | Freq: Every day | ORAL | Status: DC

## 2015-08-16 MED ORDER — PROMETHAZINE HCL 25 MG PO TABS
12.5000 mg | ORAL_TABLET | Freq: Four times a day (QID) | ORAL | Status: DC | PRN
  Administered 2015-08-16 – 2015-08-19 (×10): 12.5 mg via ORAL
  Filled 2015-08-16 (×10): qty 1

## 2015-08-16 MED ORDER — NAPROXEN 250 MG PO TABS
500.0000 mg | ORAL_TABLET | Freq: Two times a day (BID) | ORAL | Status: DC
  Administered 2015-08-16 – 2015-08-18 (×4): 500 mg via ORAL
  Filled 2015-08-16 (×4): qty 2

## 2015-08-16 MED ORDER — METHADONE HCL 10 MG/ML PO CONC: 120.0000 mg | Freq: Every day | ORAL | Status: DC

## 2015-08-16 MED ORDER — DM-GUAIFENESIN ER 30-600 MG PO TB12
1.0000 | ORAL_TABLET | Freq: Two times a day (BID) | ORAL | Status: DC
  Administered 2015-08-16: 1 via ORAL
  Filled 2015-08-16 (×2): qty 1

## 2015-08-16 MED ORDER — GABAPENTIN 300 MG PO CAPS
300.0000 mg | ORAL_CAPSULE | Freq: Two times a day (BID) | ORAL | Status: DC
  Administered 2015-08-16: 300 mg via ORAL
  Filled 2015-08-16: qty 1

## 2015-08-16 MED ORDER — SODIUM CHLORIDE 0.9 % IV BOLUS (SEPSIS)
1000.0000 mL | Freq: Once | INTRAVENOUS | Status: AC
  Administered 2015-08-16: 1000 mL via INTRAVENOUS

## 2015-08-16 MED ORDER — ALBUTEROL SULFATE (2.5 MG/3ML) 0.083% IN NEBU
5.0000 mg | INHALATION_SOLUTION | Freq: Once | RESPIRATORY_TRACT | Status: AC
  Administered 2015-08-16: 5 mg via RESPIRATORY_TRACT
  Filled 2015-08-16: qty 6

## 2015-08-16 MED ORDER — TRAZODONE HCL 100 MG PO TABS: 100.0000 mg | ORAL_TABLET | Freq: Every day | ORAL | Status: DC

## 2015-08-16 MED ORDER — ALBUTEROL SULFATE (2.5 MG/3ML) 0.083% IN NEBU: 2.5000 mg | INHALATION_SOLUTION | RESPIRATORY_TRACT | Status: DC | PRN

## 2015-08-16 MED ORDER — RAMELTEON 8 MG PO TABS
8.0000 mg | ORAL_TABLET | Freq: Every evening | ORAL | Status: DC | PRN
  Administered 2015-08-16 – 2015-08-17 (×2): 8 mg via ORAL
  Filled 2015-08-16 (×4): qty 1

## 2015-08-16 MED ORDER — DEXTROSE 5 % IV SOLN: 500.0000 mg | INTRAVENOUS | Status: DC

## 2015-08-16 MED ORDER — NICOTINE 21 MG/24HR TD PT24
21.0000 mg | MEDICATED_PATCH | Freq: Every day | TRANSDERMAL | Status: DC
  Administered 2015-08-16 – 2015-08-20 (×5): 21 mg via TRANSDERMAL
  Filled 2015-08-16 (×5): qty 1

## 2015-08-16 MED ORDER — PREDNISONE 20 MG PO TABS
60.0000 mg | ORAL_TABLET | Freq: Once | ORAL | Status: AC
  Administered 2015-08-16: 60 mg via ORAL
  Filled 2015-08-16: qty 3

## 2015-08-16 MED ORDER — DEXTROSE 5 % IV SOLN
1.0000 g | INTRAVENOUS | Status: DC
  Administered 2015-08-17 – 2015-08-19 (×3): 1 g via INTRAVENOUS
  Filled 2015-08-16 (×5): qty 10

## 2015-08-16 MED ORDER — LISINOPRIL 10 MG PO TABS
10.0000 mg | ORAL_TABLET | Freq: Every day | ORAL | Status: DC
  Administered 2015-08-16: 10 mg via ORAL
  Filled 2015-08-16: qty 1

## 2015-08-16 MED ORDER — DOXYCYCLINE HYCLATE 100 MG IV SOLR
100.0000 mg | Freq: Two times a day (BID) | INTRAVENOUS | Status: DC
  Administered 2015-08-16: 100 mg via INTRAVENOUS
  Filled 2015-08-16 (×4): qty 100

## 2015-08-16 MED ORDER — PANTOPRAZOLE SODIUM 40 MG PO TBEC
40.0000 mg | DELAYED_RELEASE_TABLET | Freq: Every day | ORAL | Status: DC
  Administered 2015-08-16 – 2015-08-19 (×4): 40 mg via ORAL
  Filled 2015-08-16 (×4): qty 1

## 2015-08-16 MED ORDER — CEFTRIAXONE SODIUM 1 G IJ SOLR
1.0000 g | Freq: Once | INTRAMUSCULAR | Status: AC
  Administered 2015-08-16: 1 g via INTRAVENOUS
  Filled 2015-08-16: qty 10

## 2015-08-16 MED ORDER — HYDROCOD POLST-CPM POLST ER 10-8 MG/5ML PO SUER
5.0000 mL | Freq: Once | ORAL | Status: AC
  Administered 2015-08-16: 5 mL via ORAL
  Filled 2015-08-16: qty 5

## 2015-08-16 MED ORDER — PREDNISONE 50 MG PO TABS
50.0000 mg | ORAL_TABLET | Freq: Every day | ORAL | Status: AC
  Administered 2015-08-17: 50 mg via ORAL
  Filled 2015-08-16: qty 1

## 2015-08-16 MED ORDER — ENOXAPARIN SODIUM 40 MG/0.4ML ~~LOC~~ SOLN
40.0000 mg | SUBCUTANEOUS | Status: DC
  Administered 2015-08-16 – 2015-08-19 (×4): 40 mg via SUBCUTANEOUS
  Filled 2015-08-16 (×4): qty 0.4

## 2015-08-16 MED ORDER — POTASSIUM CHLORIDE CRYS ER 20 MEQ PO TBCR
40.0000 meq | EXTENDED_RELEASE_TABLET | Freq: Once | ORAL | Status: AC
  Administered 2015-08-16: 40 meq via ORAL
  Filled 2015-08-16: qty 2

## 2015-08-16 NOTE — Progress Notes (Signed)
Called ED for report. Nurse not available.

## 2015-08-16 NOTE — ED Notes (Signed)
Patient up ambulatory at this time to the bathroom with no difficulty or distress

## 2015-08-16 NOTE — ED Notes (Signed)
Patient transported to X-ray 

## 2015-08-16 NOTE — Progress Notes (Signed)
Pharmacy Consult for Methadone dosing:  Patient reports Methadone liquid 120mg  po daily. [facility] is currently closed and only open Sun 6AM to 7AM and then Monday 5AM to 10Am. We cannot call to confirm dosing this PM.   Labs evaluated: QTc is prolonged at 637. Patient is on multiple QTc prolonging medications including Azithromycin and Trazodone and (if continued) Methadone.   Called IM service to discuss. Discussed with Dr. Allena KatzPatel - EKG with artifact. Team is going to hold Trazodone and change Azithromycin to Doxycycline to prevent further QTc prolongation. Holding Methadone for now until can reassess follow-up EKG in AM.   Plan: Follow-up AM EKG and follow-up with team whether to give methadone dose (due at 9AM) Monday - confirm Methadone with clinic  Link SnufferJessica Tamberlyn Midgley, PharmD, BCPS Clinical Pharmacist [phone] 08/16/2015, 5:19 PM

## 2015-08-16 NOTE — H&P (Signed)
Date: 08/16/2015               Patient Name:  Susan Bryan MRN: [id number]  DOB: 25-Aug-1971 Age / Sex: 44 y.o., female   PCP: Pcp Not In System         Medical Service: Internal Medicine Teaching Service         Attending Physician: Dr. Earl LagosNischal Narendra, MD    First Contact: Dr. Reubin MilanBilly Kennedy Pager: [phone]  Second Contact: Dr. Boykin PeekJacquelyn Gill Pager: [phone]       After Hours (After 5p/  First Contact Pager: [phone]  weekends / holidays): Second Contact Pager: [phone]   Chief Complaint: Cough, short of breath  History of Present Illness: Susan Bryan is a 44 year old female with PMH of HTN, Bipolar schizophrenia, Polysubstance abuse (tobacco, heroin, opioids, cocaine), HCV (RNA 553,152 on 10/2014; untreated), and prior pneumonia who presents with 1 week history of non-productive cough, subjective fevers and chills, dyspnea, and diaphoresis. She is accompanied by her partner. She reports being at her normal state of health when these symptoms began. She says she has had difficulty walking short distances due to dyspnea and has complaints of pressure like pain across her chest and sides that is occurring due to her cough. She says the cough is mostly non-productive with minimal white phlegm brought up. It is on and off, but she does not associate it with positional change or meals. She also reports rhinorrhea with yellow coloration and headache during this time. She reports similar episodes in the past when she was diagnosed with pneumonia 2 and 3 years ago. Of note, she came to the ED on 10/10 for these symptoms and was thought to have an acute asthma exacerbation, satted well on room air while ambulating and showed some improvement with DuoNeb treatment. She was given Solu-Medrol once and prescription for albuterol inhaler, which she has used without relief. She denies any sick contacts or recent travel.   Patient's vitals on arrival to ED were BP 154/90, pulse 82, RR 18, SpO2  94% on room air. CXR showed interval development of heterogenous opacities in bilateral lung bases compared to CXR from 10/10, thought to represent multi-focal atypical infection.  Patient with 20 pack years smoking history and reports that she is currently attending [facility] since July for heroin and opioid abuse. She and her partner both live with patient's daughter in [place]. Patient is not working, on disability with bipolar schizophrenia.  Meds: Current Facility-Administered Medications  Medication Dose Route Frequency Provider Last Rate Last Dose  . [START ON 08/17/2015] cefTRIAXone (ROCEPHIN) 1 g in dextrose 5 % 50 mL IVPB  1 g Intravenous Q24H Darl HouseholderAlison M Masters, [facility], IncRPH      . dextromethorphan-guaiFENesin (MUCINEX DM) 30-600 MG per 12 hr tablet 1 tablet  1 tablet Oral BID Marrian SalvageJacquelyn S Gill, MD      . doxycycline (VIBRAMYCIN) 100 mg in dextrose 5 % 250 mL IVPB  100 mg Intravenous Q12H Darreld McleanVishal Jalin Erpelding, MD      . enoxaparin (LOVENOX) injection 40 mg  40 mg Subcutaneous Q24H Marrian SalvageJacquelyn S Gill, MD   40 mg at 08/16/15 1601  . ipratropium-albuterol (DUONEB) 0.5-2.5 (3) MG/3ML nebulizer solution 3 mL  3 mL Nebulization Q6H Marrian SalvageJacquelyn S Gill, MD   3 mL at 08/16/15 1600  . lisinopril (PRINIVIL,ZESTRIL) tablet 10 mg  10 mg Oral Daily Marrian SalvageJacquelyn S Gill, MD   10 mg at 08/16/15 1600  . pantoprazole (PROTONIX) EC tablet  40 mg  40 mg Oral Daily Marrian Salvage, MD   40 mg at 08/16/15 1559  . [START ON 08/17/2015] predniSONE (DELTASONE) tablet 50 mg  50 mg Oral Q breakfast Marrian Salvage, MD      . promethazine (PHENERGAN) tablet 12.5 mg  12.5 mg Oral Q6H PRN Marrian Salvage, MD   12.5 mg at 08/16/15 1601  . ramelteon (ROZEREM) tablet 8 mg  8 mg Oral QHS PRN Marrian Salvage, MD        Allergies: Allergies as of 08/16/2015  . (No Known Allergies)   Past Medical History  Diagnosis Date  . Hypertension   . Bipolar 1 disorder (HCC)   . Hepatitis C   . GERD (gastroesophageal reflux  disease)   . Polysubstance abuse    Past Surgical History  Procedure Laterality Date  . Vascular surgery    . Appendectomy    . Abdominal hysterectomy     Family History  Problem Relation Age of Onset  . Heart disease Mother   . Heart disease Father    Social History   Social History  . Marital Status: Single    Spouse Name: N/A  . Number of Children: N/A  . Years of Education: N/A   Occupational History  . Not on file.   Social History Main Topics  . Smoking status: Current Every Day Smoker -- 1.00 packs/day for 20 years    Types: Cigarettes  . Smokeless tobacco: Never Used  . Alcohol Use: Yes     Comment: socially  . Drug Use: No     Comment: Heroin.  claims stopped in July.  Marland Kitchen Sexual Activity: Not Currently   Other Topics Concern  . Not on file   Social History Narrative    Review of Systems: Review of Systems  Constitutional: Positive for fever, chills, malaise/fatigue and diaphoresis. Negative for weight loss.  HENT: Negative for congestion, nosebleeds and sore throat.   Eyes: Negative for blurred vision and pain.  Respiratory: Positive for cough, shortness of breath and wheezing. Negative for hemoptysis and sputum production.   Cardiovascular: Negative for chest pain, palpitations, orthopnea, claudication, leg swelling and PND.  Gastrointestinal: Positive for nausea and constipation. Negative for heartburn, vomiting, abdominal pain, diarrhea, blood in stool and melena.  Genitourinary: Negative for dysuria, urgency and hematuria.  Musculoskeletal: Negative for myalgias, joint pain and falls.  Skin: Negative for itching and rash.  Neurological: Positive for headaches. Negative for dizziness, tingling and focal weakness.     Physical Exam: Blood pressure 148/130, pulse 80, temperature 97.8 F (36.6 C), temperature source Oral, resp. rate 16, height  (1.753 m), weight 185 lb (83.915 kg), SpO2 91 %. Physical Exam  Constitutional: She is oriented to  person, place, and time. She appears well-developed and well-nourished. No distress.  HENT:  Head: Normocephalic and atraumatic.  Mouth/Throat: Oropharynx is clear and moist. No oropharyngeal exudate.  Eyes: EOM are normal. Pupils are equal, round, and reactive to light. No scleral icterus.  Neck: Neck supple. No tracheal deviation present.  Cardiovascular: Normal rate, regular rhythm and intact distal pulses.  Exam reveals no gallop and no friction rub.   No murmur heard. Pulmonary/Chest: She exhibits no tenderness.  Coarse wheezing bilateral lung fields.  Abdominal: Soft. Bowel sounds are normal. There is no tenderness.  Musculoskeletal: Normal range of motion. She exhibits no edema or tenderness.  Lymphadenopathy:    She has no cervical adenopathy.  Neurological: She is alert and oriented  to person, place, and time.  Skin: Skin is warm. She is diaphoretic.     Lab results: Basic Metabolic Panel:  Recent Labs  08/65/78 1100  NA 141  K 3.1*  CL 105  CO2 28  GLUCOSE 93  BUN <5*  CREATININE 0.63  CALCIUM 8.9   Liver Function Tests:  Recent Labs  08/16/15 1100  AST 31  ALT 17  ALKPHOS 79  BILITOT 0.9  PROT 6.6  ALBUMIN 3.3*   No results for input(s): LIPASE, AMYLASE in the last 72 hours. No results for input(s): AMMONIA in the last 72 hours. CBC:  Recent Labs  08/16/15 1100  WBC 16.8*  NEUTROABS 14.5*  HGB 14.9  HCT 44.1  MCV 100.0  PLT 274   Cardiac Enzymes: No results for input(s): CKTOTAL, CKMB, CKMBINDEX, TROPONINI in the last 72 hours. BNP: No results for input(s): PROBNP in the last 72 hours. D-Dimer: No results for input(s): DDIMER in the last 72 hours. CBG: No results for input(s): GLUCAP in the last 72 hours. Hemoglobin A1C: No results for input(s): HGBA1C in the last 72 hours. Fasting Lipid Panel: No results for input(s): CHOL, HDL, LDLCALC, TRIG, CHOLHDL, LDLDIRECT in the last 72 hours. Thyroid Function Tests: No results for input(s):  TSH, T4TOTAL, FREET4, T3FREE, THYROIDAB in the last 72 hours. Anemia Panel: No results for input(s): VITAMINB12, FOLATE, FERRITIN, TIBC, IRON, RETICCTPCT in the last 72 hours. Coagulation: No results for input(s): LABPROT, INR in the last 72 hours. Urine Drug Screen: Drugs of Abuse     Component Value Date/Time   LABOPIA POSITIVE* 08/16/2015 1544   COCAINSCRNUR NONE DETECTED 08/16/2015 1544   LABBENZ NONE DETECTED 08/16/2015 1544   AMPHETMU NONE DETECTED 08/16/2015 1544   THCU PENDING 08/16/2015 1544   LABBARB NONE DETECTED 08/16/2015 1544    Alcohol Level: No results for input(s): ETH in the last 72 hours. Urinalysis: No results for input(s): COLORURINE, LABSPEC, PHURINE, GLUCOSEU, HGBUR, BILIRUBINUR, KETONESUR, PROTEINUR, UROBILINOGEN, NITRITE, LEUKOCYTESUR in the last 72 hours.  Invalid input(s): APPERANCEUR Misc. Labs:   Imaging results:  Dg Chest 2 View  08/16/2015  CLINICAL DATA:  Nonproductive cough and shortness of breath for 1 week. History of smoking. EXAM: CHEST  2 VIEW COMPARISON:  08/11/2015; 06/12/2014; 10/23/2014; chest CT - 10/23/2012 FINDINGS: Grossly unchanged cardiac silhouette and mediastinal contours. The lungs remain hyperexpanded with thinning of the biapical pulmonary parenchyma. Interval development of extensive ill-defined heterogeneous opacities within the bilateral lung bases. No pneumothorax. There is a minimal amount of pleural parenchymal thickening about the bilateral major and the right minor fissures. No pleural effusion. No acute osseus abnormalities. IMPRESSION: Interval development of extensive ill-defined heterogeneous within the bilateral lung bases, predominantly within the right middle lobe and lingula, nonspecific though favored to represent multi focal atypical infection superimposed on emphysematous change. A follow-up chest radiograph in 3 to 4 weeks after treatment is recommended to ensure resolution. Electronically Signed   By: Simonne Come  M.D.   On: 08/16/2015 10:03    Other results: EKG: poor quality EKG with artifact, normal sinus rhythm, prolonged QTc.  Assessment & Plan by Problem: Principal Problem:   Community acquired pneumonia Active Problems:   Hypertension   Polysubstance abuse   Bipolar 1 disorder (HCC)  Pneumonia: 44 y.o. Patient with PMH of HTN, Bipolar, HCV, polysubstance abuse, and pneumonia who presents with 1 week history of non-productive cough, dyspnea, and subjective fevers. CXR compared to prior on 10/10 showing interval development of heterogenous opacities within bilateral  lung bases thought to be multi focal infection superimposed on emphysematous change. She says her current symptoms are similar to her previous pneumonia episodes 2 and 3 years ago. Although she is afebrile and with non-productive cough, she likely has a viral or bacterial pneumonia. CXR, elevated white count, and procalcitonin of 0.42 would favor a bacterial cause of pneumonia. She does report a childhood history of asthma and strong smoking history which may explain her emphysematous changes and possible underlying COPD, but doubt this is an acute COPD exacerbation. She has been satting well on room air, not tachycardic, and without pleuritic chest pain which makes me doubt pulmonary embolism at this time. Ceftriaxone and Azithromycin were started in the ED. -Continue IV Ceftriaxone and switch Azithromycin to IV Doxycycline due to prolonged QTc. Can consider switching to PO. -Mucinex DM 30-600 mg po BID -f/u blood cultures -Sputum cultures -Strep pneumo urine antigen -DuoNeb q6h -Prednisone 50 mg tomorrow  Polysubstance abuse: Patient currently on methadone for heroin/opioid abuse treatment. [facility] and currently on 120 mg Methadone which she usually takes around 9 AM. She brought her dose for tomorrow. Will have pharmacy dose Methadone while in hospital, she will need to return her bottle to her clinic. -Methadone  per pharmacy -repeat EKG in am prior to Methadone for prolonged QTc (Methadone can prolong) -UDS -HIV  HTN: Patient hypertensive on arrival. She takes Lisinopril 10 mg daily in the morning for hypertension, but did not take prior to admission. -Continue home Lisinopril 10 mg  GERD: Continue Pantoprazole 40 mg daily  Diet: Heart  DVT ppx: Lovenox  Code: Full  Dispo: Disposition is deferred at this time, awaiting improvement of current medical problems. Anticipated discharge in approximately 1-3 day(s).   The patient does have a current PCP (Pcp Not In System) and does need a hospital follow-up appointment after discharge.  The patient does not have transportation limitations that hinder transportation to clinic appointments.  Signed: Darreld Mclean, MD 08/16/2015, 5:41 PM

## 2015-08-16 NOTE — ED Provider Notes (Signed)
CSN: [id number]     Arrival date & time 08/16/15  4098 History   First MD Initiated Contact with Patient 08/16/15 [phone]     Chief Complaint  Patient presents with  . Cough     (Consider location/radiation/quality/duration/timing/severity/associated sxs/prior Treatment) HPI 44 year old female presents today with cough that began 1 week ago today. She has had some fever at home subjectively. Her cough has been nonproductive. She states that she is a smoker but has not been able to smoke for the past 5 days. She was seen here on Monday and had her albuterol be filled with an HFA. She has been taking this twice a day. She has not been taking any other medications. She was given a dose of Solu-Medrol while in the ED. The cough has worsened. She is somewhat dyspneic. She has pain from ongoing coughing but has no preceding focal pain. She has been taking by mouth without vomiting although her appetite has been somewhat decreased.  Past Medical History  Diagnosis Date  . Hypertension   . Bipolar 1 disorder [facility])    Past Surgical History  Procedure Laterality Date  . Vascular surgery    . Appendectomy    . Abdominal hysterectomy     History reviewed. No pertinent family history. Social History  Substance Use Topics  . Smoking status: Current Every Day Smoker -- 1.00 packs/day for 20 years    Types: Cigarettes  . Smokeless tobacco: Never Used  . Alcohol Use: Yes     Comment: socially   OB History    No data available     Review of Systems  All other systems reviewed and are negative.     Allergies  Review of patient's allergies indicates no known allergies.  Home Medications   Prior to Admission medications   Medication Sig Start Date End Date Taking? Authorizing Provider  albuterol (PROVENTIL) (2.5 MG/3ML) 0.083% nebulizer solution Take 3 mLs (2.5 mg total) by nebulization every 6 (six) hours as needed for wheezing or shortness of breath. 08/11/15  Yes Jeffrey Hedges, PA-C   Aspirin-Acetaminophen (GOODYS BODY PAIN PO) Take 1 tablet by mouth 3 (three) times daily as needed (back pain).   Yes Historical Provider, MD  gabapentin (NEURONTIN) 300 MG capsule Take 300 mg by mouth 2 (two) times daily. 04/14/15  Yes Historical Provider, MD  lisinopril (PRINIVIL,ZESTRIL) 10 MG tablet Take 10 mg by mouth daily.   Yes Historical Provider, MD  omeprazole (PRILOSEC) 20 MG capsule Take 20 mg by mouth daily.   Yes Historical Provider, MD  traZODone (DESYREL) 100 MG tablet Take 100 mg by mouth at bedtime. 02/26/15  Yes Historical Provider, MD  hydrOXYzine (ATARAX/VISTARIL) 25 MG tablet Take 1 tablet (25 mg total) by mouth every 6 (six) hours. Patient not taking: Reported on 08/11/2015 06/16/15   Arthor Captain, PA-C  ibuprofen (ADVIL,MOTRIN) 800 MG tablet Take 1 tablet (800 mg total) by mouth every 8 (eight) hours as needed for mild pain or moderate pain. Patient not taking: Reported on 05/15/2015 12/25/14   Trixie Dredge, PA-C  naproxen (NAPROSYN) 500 MG tablet Take 1 tablet (500 mg total) by mouth 2 (two) times daily. Patient not taking: Reported on 05/15/2015 11/04/14   Joycie Peek, PA-C  naproxen (NAPROSYN) 500 MG tablet Take 1 tablet (500 mg total) by mouth 2 (two) times daily as needed for mild pain, moderate pain or headache (TAKE WITH MEALS.). Patient not taking: Reported on 08/11/2015 05/15/15   Mercedes Camprubi-Soms, PA-C  oxyCODONE-acetaminophen (PERCOCET/ROXICET) 5-325  MG per tablet Take 1 tablet by mouth every 6 (six) hours as needed for severe pain. Patient not taking: Reported on 05/15/2015 03/16/15   Marisa Severin, MD  predniSONE (DELTASONE) 20 MG tablet 3 tabs po daily x 3 days, then 2 tabs x 3 days, then 1.5 tabs x 3 days, then 1 tab x 3 days, then 0.5 tabs x 3 days Patient not taking: Reported on 08/11/2015 06/16/15   Arthor Captain, PA-C  traMADol (ULTRAM) 50 MG tablet Take 1 tablet (50 mg total) by mouth every 6 (six) hours as needed. Patient not taking: Reported on  08/11/2015 05/23/15   Garlon Hatchet, PA-C   BP 164/88 mmHg  Pulse 90  Temp(Src) 98.5 F (36.9 C) (Oral)  Resp 18  Ht  (1.753 m)  Wt 185 lb (83.915 kg)  BMI 27.31 kg/m2  SpO2 95% Physical Exam  Constitutional: She is oriented to person, place, and time. She appears well-developed and well-nourished.  HENT:  Head: Normocephalic and atraumatic.  Right Ear: External ear normal.  Left Ear: External ear normal.  Nose: Nose normal.  Mouth/Throat: Oropharynx is clear and moist.  Eyes: Conjunctivae and EOM are normal. Pupils are equal, round, and reactive to light.  Neck: Normal range of motion. Neck supple.  Cardiovascular: Normal rate, regular rhythm, normal heart sounds and intact distal pulses.   Pulmonary/Chest: Effort normal.  Diffuse scattered rhonchi with coughing on exam  Abdominal: Soft. Bowel sounds are normal.  Musculoskeletal: Normal range of motion.  Neurological: She is alert and oriented to person, place, and time. She has normal reflexes.  Skin: Skin is warm and dry.  Psychiatric: She has a normal mood and affect. Her behavior is normal. Judgment and thought content normal.  Nursing note and vitals reviewed.   ED Course  Procedures (including critical care time) Labs Review Labs Reviewed - No data to display  Imaging Review Dg Chest 2 View  08/16/2015  CLINICAL DATA:  Nonproductive cough and shortness of breath for 1 week. History of smoking. EXAM: CHEST  2 VIEW COMPARISON:  08/11/2015; 06/12/2014; 10/23/2014; chest CT - 10/23/2012 FINDINGS: Grossly unchanged cardiac silhouette and mediastinal contours. The lungs remain hyperexpanded with thinning of the biapical pulmonary parenchyma. Interval development of extensive ill-defined heterogeneous opacities within the bilateral lung bases. No pneumothorax. There is a minimal amount of pleural parenchymal thickening about the bilateral major and the right minor fissures. No pleural effusion. No acute osseus  abnormalities. IMPRESSION: Interval development of extensive ill-defined heterogeneous within the bilateral lung bases, predominantly within the right middle lobe and lingula, nonspecific though favored to represent multi focal atypical infection superimposed on emphysematous change. A follow-up chest radiograph in 3 to 4 weeks after treatment is recommended to ensure resolution. Electronically Signed   By: Simonne Come M.D.   On: 08/16/2015 10:03   I have personally reviewed and evaluated these images and lab results as part of my medical decision-making.   EKG Interpretation   Date/Time:  Saturday August 16 2015 11:05:10 EDT Ventricular Rate:  95 PR Interval:  137 QRS Duration: 84 QT Interval:  507 QTC Calculation: 637 R Axis:   66 Text Interpretation:  Normal sinus rhythm Poor data quality,  interpretation may be adversely affected Confirmed by Kenidi Elenbaas MD, Calista Crain  ([id number]) on 08/16/2015 11:48:15 AM      MDM   Final diagnoses:  CAP (community acquired pneumonia)  Smoker   44 y.o. Smoker with multifocal infiltrates on cxr.  Patient with hypertension and hypertensive  here.  O.W hemodynamically stable.  No evidence of sepsis.  IV antibiotics rocephin and zithromax given to cover cap.  Patient continues somewhat dyspneic with sats 95% and some tachypnea- oxygen started by [place] for patient comfort.   Unassigned page for admission.\\  Discussed with DR. Gill and plan admission to teaching service.   Margarita Grizzleanielle Naysa Puskas, MD 08/16/15 [phone]

## 2015-08-16 NOTE — ED Notes (Addendum)
Cough since Saturday; seen on Monday for the same. Not feeling any better; coughing and cannot sleep. Cannot take a deep a breath without coughing.

## 2015-08-16 NOTE — Progress Notes (Addendum)
Susan Bryan [id number] Admission Data: 08/16/2015 6:34 PM Attending Provider: Earl LagosNischal Narendra, MD  PCP:Pcp Not In System Consults/ Treatment Team:    Ernst SpellHolly Denise Bryan is a 44 y.o. female patient admitted from ED awake, alert  & orientated  X 3,  Full Code, VSS - Blood pressure 148/130, pulse 80, temperature 97.8 F (36.6 C), temperature source Oral, resp. rate 16, height 5\' 9"  (1.753 m), weight 83.915 kg (185 lb), SpO2 91 %., no c/o shortness of breath, no c/o chest pain, no distress noted.     IV site WDL:  antecubital left, condition patent and no redness with a transparent dsg that's clean dry and intact.  Allergies:  No Known Allergies   Past Medical History  Diagnosis Date  . Hypertension   . Bipolar 1 disorder (HCC)   . Hepatitis C   . GERD (gastroesophageal reflux disease)   . Polysubstance abuse     History:  obtained from chart review. Tobacco/alcohol: Smoked 1 packs per day for 30 years social drinker Smokes marijuana, last smoked yesterday. Took a vicodin yesterday  Pt orientation to unit, room and routine. Information packet given to patient/family and safety video watched.  Admission INP armband ID verified with patient/family, and in place. SR up x 2, fall risk assessment complete with Patient and family verbalizing understanding of risks associated with falls. Pt verbalizes an understanding of how to use the call bell and to call for help before getting out of bed.  Skin, clean-dry- intact without evidence of bruising, or skin tears.   No evidence of skin break down noted on exam. color normal    Will cont to monitor and assist as needed.  Camillo FlamingVicki L Izaih Kataoka, RN 08/16/2015 6:34 PM

## 2015-08-17 DIAGNOSIS — I10 Essential (primary) hypertension: Secondary | ICD-10-CM | POA: Diagnosis not present

## 2015-08-17 DIAGNOSIS — Z8709 Personal history of other diseases of the respiratory system: Secondary | ICD-10-CM

## 2015-08-17 DIAGNOSIS — B1920 Unspecified viral hepatitis C without hepatic coma: Secondary | ICD-10-CM | POA: Diagnosis not present

## 2015-08-17 DIAGNOSIS — J189 Pneumonia, unspecified organism: Secondary | ICD-10-CM | POA: Diagnosis not present

## 2015-08-17 DIAGNOSIS — F1910 Other psychoactive substance abuse, uncomplicated: Secondary | ICD-10-CM | POA: Diagnosis not present

## 2015-08-17 DIAGNOSIS — F319 Bipolar disorder, unspecified: Secondary | ICD-10-CM | POA: Diagnosis not present

## 2015-08-17 LAB — HIV ANTIBODY (ROUTINE TESTING W REFLEX): HIV Screen 4th Generation wRfx: NONREACTIVE

## 2015-08-17 MED ORDER — DOXYCYCLINE HYCLATE 100 MG PO TABS
100.0000 mg | ORAL_TABLET | Freq: Two times a day (BID) | ORAL | Status: DC
  Administered 2015-08-17 – 2015-08-20 (×6): 100 mg via ORAL
  Filled 2015-08-17 (×6): qty 1

## 2015-08-17 MED ORDER — GUAIFENESIN-DM 100-10 MG/5ML PO SYRP
5.0000 mL | ORAL_SOLUTION | ORAL | Status: DC | PRN
  Administered 2015-08-17 – 2015-08-18 (×6): 5 mL via ORAL
  Filled 2015-08-17 (×6): qty 5

## 2015-08-17 MED ORDER — BENZONATATE 100 MG PO CAPS
100.0000 mg | ORAL_CAPSULE | Freq: Once | ORAL | Status: AC
  Administered 2015-08-17: 100 mg via ORAL
  Filled 2015-08-17: qty 1

## 2015-08-17 MED ORDER — IPRATROPIUM-ALBUTEROL 0.5-2.5 (3) MG/3ML IN SOLN: 3.0000 mL | Freq: Four times a day (QID) | RESPIRATORY_TRACT | Status: DC

## 2015-08-17 MED ORDER — LISINOPRIL 20 MG PO TABS
20.0000 mg | ORAL_TABLET | Freq: Every day | ORAL | Status: DC
  Administered 2015-08-17 – 2015-08-20 (×4): 20 mg via ORAL
  Filled 2015-08-17 (×4): qty 1

## 2015-08-17 MED ORDER — METHADONE HCL 10 MG PO TABS
120.0000 mg | ORAL_TABLET | Freq: Every day | ORAL | Status: DC
  Administered 2015-08-17 – 2015-08-20 (×4): 120 mg via ORAL
  Filled 2015-08-17 (×4): qty 12

## 2015-08-17 MED ORDER — METHADONE HCL 5 MG PO TABS: 120.0000 mg | ORAL_TABLET | Freq: Every day | ORAL | Status: DC

## 2015-08-17 MED ORDER — IPRATROPIUM-ALBUTEROL 0.5-2.5 (3) MG/3ML IN SOLN
3.0000 mL | RESPIRATORY_TRACT | Status: DC
  Administered 2015-08-17 (×3): 3 mL via RESPIRATORY_TRACT
  Filled 2015-08-17 (×3): qty 3

## 2015-08-17 MED ORDER — DICLOFENAC SODIUM 1 % TD GEL
2.0000 g | Freq: Four times a day (QID) | TRANSDERMAL | Status: DC
  Administered 2015-08-17 – 2015-08-19 (×9): 2 g via TOPICAL
  Filled 2015-08-17: qty 100

## 2015-08-17 MED ORDER — PREDNISONE 20 MG PO TABS
40.0000 mg | ORAL_TABLET | Freq: Every day | ORAL | Status: DC
  Administered 2015-08-18: 40 mg via ORAL
  Filled 2015-08-17: qty 2

## 2015-08-17 NOTE — Progress Notes (Signed)
Subjective: Susan Bryan is a 44yo F with PMH HTN, bipolar disorder, schizophrenia, untreated HCV, polysubstance abuse (tobacco, heroin/IVDU, opioids, cocaine), and 2 prior episodes of pneumonia here for 1 week of non-productive cough, subjective fevers and chills, and shortness of breath. She continued to have dry, non-productive cough overnight, for which she was given tessalon without much relief. She also complains of rib pain from continued coughing spells. Otherwise, she has had no acute events overnight and complains of no other symptoms at this time.   Objective: Vital signs in last 24 hours: Filed Vitals:   08/16/15 2159 08/17/15 0114 08/17/15 0636 08/17/15 0752  BP: 149/89  172/86   Pulse: 73  63   Temp: 98.7 F (37.1 C)  98.6 F (37 C)   TempSrc: Oral  Oral   Resp: 18  18   Height:      Weight:      SpO2: 95% 94% 96% 95%   Weight change:   Intake/Output Summary (Last 24 hours) at 08/17/15 1024 Last data filed at 08/16/15 1830  Gross per 24 hour  Intake    250 ml  Output      0 ml  Net    250 ml   General appearance: alert and no distress Head: Normocephalic, without obvious abnormality, atraumatic Eyes: conjunctivae/corneas clear. PERRL, EOM's intact. Fundi benign. Ears: normal TM's and external ear canals both ears Nose: Nares normal. Septum midline. Mucosa normal. No drainage or sinus tenderness. Throat: lips, mucosa, and tongue normal; teeth and gums normal Neck: no adenopathy, no carotid bruit, no JVD, supple, symmetrical, trachea midline and thyroid not enlarged, symmetric, no tenderness/mass/nodules Lungs: bilateral rales in both inspiration and expiration auscultated. No wheezes or rhonchi heard. Normal respiratory effort.  Heart: regular rate and rhythm, S1, S2 normal, no murmur, click, rub or gallop Abdomen: soft, non-tender; bowel sounds normal; no masses,  no organomegaly Extremities: extremities normal, atraumatic, no cyanosis or edema Pulses: 2+ and  symmetric Skin: Skin color, texture, turgor normal. No rashes or lesions Neurologic: Alert and oriented X 3, normal strength and tone. Normal symmetric reflexes. Normal coordination and gait   Lab Results: Basic Metabolic Panel:  Recent Labs Lab 08/16/15 1100 08/16/15 2230  NA 141  --   K 3.1*  --   CL 105  --   CO2 28  --   GLUCOSE 93  --   BUN <5*  --   CREATININE 0.63  --   CALCIUM 8.9  --   MG  --  1.8   Liver Function Tests:  Recent Labs Lab 08/16/15 1100  AST 31  ALT 17  ALKPHOS 79  BILITOT 0.9  PROT 6.6  ALBUMIN 3.3*   CBC:  Recent Labs Lab 08/16/15 1100  WBC 16.8*  NEUTROABS 14.5*  HGB 14.9  HCT 44.1  MCV 100.0  PLT 274   Studies/Results: Dg Chest 2 View  08/16/2015  CLINICAL DATA:  Nonproductive cough and shortness of breath for 1 week. History of smoking. EXAM: CHEST  2 VIEW COMPARISON:  08/11/2015; 06/12/2014; 10/23/2014; chest CT - 10/23/2012 FINDINGS: Grossly unchanged cardiac silhouette and mediastinal contours. The lungs remain hyperexpanded with thinning of the biapical pulmonary parenchyma. Interval development of extensive ill-defined heterogeneous opacities within the bilateral lung bases. No pneumothorax. There is a minimal amount of pleural parenchymal thickening about the bilateral major and the right minor fissures. No pleural effusion. No acute osseus abnormalities. IMPRESSION: Interval development of extensive ill-defined heterogeneous within the bilateral lung bases, predominantly  within the right middle lobe and lingula, nonspecific though favored to represent multi focal atypical infection superimposed on emphysematous change. A follow-up chest radiograph in 3 to 4 weeks after treatment is recommended to ensure resolution. Electronically Signed   By: Simonne ComeJohn  Watts M.D.   On: 08/16/2015 10:03   Assessment/Plan: Principal Problem:   Community acquired pneumonia Active Problems:   Hypertension   Polysubstance abuse   Bipolar 1 disorder  (HCC)   Hepatitis C  Pneumonia - Patient presents with 1 week of dry cough, subjective fevers, and shortness of breath initially thought to be an asthma exacerbation with negative CXR on 10/10, with worsening course with no improvement using albuterol over the past week. CXR on 10/15 shows heterogeneous opacities bilaterally most consistent with multifocal infection superimposed on emphesematous changes. She does have a history of similar symptoms 2 and 3 years ago, respectively. Currently, still has nonproductive cough, but otherwise afebrile, hemodynamically stable and satting in the mid-90's on room air in no apparent respiratory distress. -Continue IV ceftriaxone and doxycycline (switched from azithromycin 2/2 long QTc of 637 on initial EKG) -Mucinex DM 30-600mg  PO BID -Tessalon PRN for continued cough -Duonebs q6h -Prednisone 50mg  today -Naproxen PRN for breakthrough post-tussive pain -F/u BCx and sputum cultures -F/u strep pneumo urine antigen  Polysubstance abuse - Patient currently on methadone 120mg  QD (dose ~9am daily) for heroin/opioid abuse treatment, follows with [facility]. -Methadone per pharmacy -Repeat EKG this morning for initial long QTc showed QTc of 429 - Methadone dose given today  HTN -Continue home lisinopril 10mg  QD  GERD -Continue home pantoproazole 40mg  QD  PPX -DVT: Lovenox  Dispo: Disposition is deferred at this time, awaiting improvement of current medical problems.  Anticipated discharge in approximately 1 day(s).   Darrick HuntsmanWilliam R Aalijah Lanphere, MD 08/17/2015, 10:24 AM

## 2015-08-17 NOTE — Progress Notes (Signed)
Called on call for patient coughing will place orders

## 2015-08-17 NOTE — Progress Notes (Signed)
Patient seen and examined. Case d/w residents in detail.  HPI: 44 y/o female with PMH of HTN, schizophrenia, polysubstance abuse, Hep C, PNA who p/w non productive cough * 1 week. Patient states that she has had persistent cough over the last week which was dry, no hemoptysis. It was associated with subjective fevers and chills, R sided pleuritic CP worse with coughing, worsening SOB/DOE and assoc rhinorrhea. She was seen in the ED on 10/10 was treated with solumedrol and nebs for possible asthma exacerbation. She was given an albuterol inhaler which did not give her relief. No n/v,no diarrhea, no abd pain, no palpitations, no diaphoresis, no syncope.  Physical Exam: Gen: AAO*3, intermittent coughing fits CVS: RRR, normal heart sounds Lungs: b/l exp wheezes + Abd: soft, non tender, BS + Ext: no edema  Assessment and Plan: 44 y/o female with cough, SOB secondary to multi focal PNA  PNA: - will c/w ceftriaxone and doxy for now - Was initially started on azithromycin but was changed to doxy given prolonged QTc on EKG - will f/u blood cx, strep pneumo Ag and sputum cx - Patient with leukocytosis secondary to PNA. Will monitor - c/w mucinex DM and tesalon perles for cough - c/w nebs q 6 hrs ATC - Would consider voltaren gel for MSK CP - C/w PO prednisone for possible asthma exacerbation secondary to PNA  Polysubstance abuse: - resume methadone today - She will f/u with her methadone clinic on d/c  Prolonged QTc: - repeat EKG with normal QT interval - Will monitor

## 2015-08-18 ENCOUNTER — Encounter (HOSPITAL_COMMUNITY): Payer: Self-pay | Admitting: General Practice

## 2015-08-18 ENCOUNTER — Observation Stay (HOSPITAL_COMMUNITY): Payer: Medicare HMO

## 2015-08-18 DIAGNOSIS — K219 Gastro-esophageal reflux disease without esophagitis: Secondary | ICD-10-CM | POA: Diagnosis not present

## 2015-08-18 DIAGNOSIS — F1199 Opioid use, unspecified with unspecified opioid-induced disorder: Secondary | ICD-10-CM

## 2015-08-18 DIAGNOSIS — J189 Pneumonia, unspecified organism: Secondary | ICD-10-CM | POA: Diagnosis not present

## 2015-08-18 DIAGNOSIS — I10 Essential (primary) hypertension: Secondary | ICD-10-CM | POA: Diagnosis not present

## 2015-08-18 MED ORDER — IPRATROPIUM-ALBUTEROL 0.5-2.5 (3) MG/3ML IN SOLN
3.0000 mL | RESPIRATORY_TRACT | Status: DC
  Administered 2015-08-18 – 2015-08-20 (×11): 3 mL via RESPIRATORY_TRACT
  Filled 2015-08-18 (×11): qty 3

## 2015-08-18 MED ORDER — FLUCONAZOLE 100 MG PO TABS
150.0000 mg | ORAL_TABLET | Freq: Once | ORAL | Status: AC
  Administered 2015-08-18: 150 mg via ORAL
  Filled 2015-08-18: qty 2

## 2015-08-18 MED ORDER — POLYETHYLENE GLYCOL 3350 17 G PO PACK
17.0000 g | PACK | Freq: Every day | ORAL | Status: DC
  Administered 2015-08-18 – 2015-08-20 (×3): 17 g via ORAL
  Filled 2015-08-18 (×3): qty 1

## 2015-08-18 MED ORDER — IPRATROPIUM-ALBUTEROL 0.5-2.5 (3) MG/3ML IN SOLN: 3.0000 mL | RESPIRATORY_TRACT | Status: DC

## 2015-08-18 MED ORDER — TRAZODONE HCL 100 MG PO TABS
100.0000 mg | ORAL_TABLET | Freq: Every evening | ORAL | Status: DC | PRN
Start: 2015-08-18 — End: 2015-08-20
  Administered 2015-08-18 – 2015-08-19 (×2): 100 mg via ORAL
  Filled 2015-08-18 (×2): qty 1

## 2015-08-18 MED ORDER — ASPIRIN-ACETAMINOPHEN-CAFFEINE 250-250-65 MG PO TABS
1.0000 | ORAL_TABLET | Freq: Once | ORAL | Status: AC
  Administered 2015-08-18: 1 via ORAL
  Filled 2015-08-18: qty 1

## 2015-08-18 MED ORDER — GUAIFENESIN-DM 100-10 MG/5ML PO SYRP
10.0000 mL | ORAL_SOLUTION | ORAL | Status: DC | PRN
  Administered 2015-08-18 – 2015-08-19 (×5): 10 mL via ORAL
  Filled 2015-08-18 (×5): qty 10

## 2015-08-18 MED ORDER — NAPROXEN 250 MG PO TABS
500.0000 mg | ORAL_TABLET | Freq: Two times a day (BID) | ORAL | Status: DC | PRN
  Administered 2015-08-18: 500 mg via ORAL
  Filled 2015-08-18: qty 2

## 2015-08-18 MED ORDER — INFLUENZA VAC SPLIT QUAD 0.5 ML IM SUSY
0.5000 mL | PREFILLED_SYRINGE | INTRAMUSCULAR | Status: AC
  Administered 2015-08-19: 0.5 mL via INTRAMUSCULAR
  Filled 2015-08-18: qty 0.5

## 2015-08-18 NOTE — Progress Notes (Signed)
Subjective: [name] is a 44yo F with PMH HTN, bipolar disorder, schizophrenia, untreated HCV, polysubstance abuse (tobacco, heroin/IVDU, opioids, cocaine), and 2 prior episodes of pneumonia here for 1 week of non-productive cough, subjective fevers and chills, and shortness of breath. She still has mostly dry, occasionally productive coughing that causes her chest and rib discomfort. She still gets significantly short of breath when she tries to walk even short distances. She has no other issues at this time.  Objective: Vital signs in last 24 hours: Filed Vitals:   08/17/15 1530 08/17/15 2225 08/18/15 0529 08/18/15 0856  BP:  141/81 163/90   Pulse:  69 60 61  Temp:  98.7 F (37.1 C) 98.4 F (36.9 C)   TempSrc:  Oral Oral   Resp:  18 18 18   Height:      Weight:      SpO2: 96% 98% 100% 100%   Weight change:   Intake/Output Summary (Last 24 hours) at 08/18/15 1035 Last data filed at 08/18/15 1010  Gross per 24 hour  Intake   1200 ml  Output      0 ml  Net   1200 ml   General appearance: alert and no distress Lungs: bilateral rales and wheezes in both inspiration and expiration auscultated. No rhonchi heard. Normal respiratory effort.  Heart: regular rate and rhythm, S1, S2 normal, no murmur, click, rub or gallop Abdomen: soft, non-tender; bowel sounds normal; no masses,  no organomegaly Extremities: extremities normal, atraumatic, no cyanosis or edema Pulses: 2+ and symmetric Skin: Skin color, texture, turgor normal. No rashes or lesions Neurologic: Alert and oriented X 3, normal strength and tone. Normal symmetric reflexes. Normal coordination and gait   Lab Results: No new labs  Assessment/Plan: Principal Problem:   Community acquired pneumonia Active Problems:   Hypertension   Polysubstance abuse   Bipolar 1 disorder (HCC)   Hepatitis C   Personal history of asthma  Pneumonia - Patient presents with 1 week of dry cough, subjective fevers, and shortness of breath  initially thought to be an asthma exacerbation with negative CXR on 10/10, with worsening course with no improvement using albuterol over the past week. CXR on 10/15 shows heterogeneous opacities bilaterally most consistent with multifocal infection superimposed on emphesematous changes. She does have a history of similar symptoms 2 and 3 years ago, respectively. Currently, still has nonproductive cough, but otherwise afebrile, hemodynamically stable and satting in the mid-90's on room air in no apparent respiratory distress. Strep pneumoniae urine antigen negative. Given her multifocal involvement and physical exam as well as high-risk status, a CT chest may be helpful in differentiating from septic emboli or other processes further explaining her clinical picture. -Obtain CT chest  -Continue IV ceftriaxone and PO doxycycline (switched from azithromycin 2/2 long QTc of 637 on initial EKG) -Mucinex DM 30-600mg  PO BID -Tessalon PRN for continued cough -Duonebs q6h -Prednisone 40mg  today, continue to taper -Incentive spirometry -Pertussis antigen -Naproxen PRN for breakthrough post-tussive pain and headache -F/u BCx and sputum cultures  Polysubstance abuse - Patient currently on methadone 120mg  QD (dose ~9am daily) for heroin/opioid abuse treatment, follows with [facility]. -Methadone per pharmacy -Repeat EKG yesterday for  initial long QTc showed QTc in 420s  HTN -Lisinopril 20mg  QD  GERD -Continue home pantoproazole 40mg  QD  PPX -DVT: Lovenox  Dispo: Disposition is deferred at this time, awaiting improvement of current medical problems.  Anticipated discharge in approximately 1-2 day(s).   Darrick HuntsmanWilliam R Lonie Rummell, MD 08/18/2015, 10:35 AM

## 2015-08-18 NOTE — Progress Notes (Signed)
Pharmacy Re: Methadone dosing  Confirmed Methadone dose with [facility] [phone]).  - Methadone 120 mg daily, as reported by patient and ordered here.  - [facility] dispenses 10 mg/ml solution, but report acceptable to use tablets while in the hospital.  - QTc 429 on 08/17/15 EKG.  Nicolette Bangheresa Almee Pelphrey, RPh  Pager: [phone] 08/18/2015 9:12 AM

## 2015-08-18 NOTE — Progress Notes (Signed)
Internal Medicine Attending:   I saw and examined the patient. I reviewed the resident's note and I agree with the resident's findings and plan as documented in the resident's note.  44 year old woman with polysubstance abuse admitted with multifocal pneumonia. Now hospital day 2 on appropriate antibiotics for CAP but without much clinical improvement. She has significant dyspnea with minimal exertion preventing her from ambulating outside her room. Complains of pleuritic chest pain that is worsening. Cough minimally productive, on lung exam there are loud course crackles throughout the lungs without wheezing. Blood cultures are no growth, sputum cultures pending. Because of pleuritic pain and lack of improvement, we will get a CT chest today to evaluate for reason that the pneumonia is not improving such as necrosis, cavitation, bronchiectasis, or septic emboli. Because of her substance abuse history, she is high risk for an underlying pulmonary disease. She also has recent exposure to small children and unclear immunization history, so will check bordetella pcr swab.   I am sceptical of the diagnosis of asthma. We do not have any spirometry to confirm this, it is a childhood diagnosis but without any chronic management. No wheezing on exam so I would stop prednisone for now until we have a better understanding of the cause of the pneumonia. The CT chest will give us a better view of underlying parenchymal disease that may be caused by tobacco abuse.

## 2015-08-18 NOTE — Progress Notes (Addendum)
Called on paged for patient headache and cough

## 2015-08-18 NOTE — Progress Notes (Signed)
Report given to Cassie, RN

## 2015-08-18 NOTE — Progress Notes (Signed)
Pt ambulated in hallway approximately 300 feet on room air, tolerated fairly well but had constant dry cough for the duration of the walk. Pt assisted back to room and resting at this time. Family member at bedside. Call light left within reach.

## 2015-08-18 NOTE — Care Management Note (Signed)
Case Management Note  Patient Details  Name: Ernst SpellHolly Denise Lafuente MRN: [id number] Date of Birth: [birth year]  Subjective/Objective:   Patient lives with a friend, conts with pna sxs, conts onf iv abx.  Pt eval pending.  NCM will cont to follow for dc needs.                 Action/Plan:   Expected Discharge Date:                  Expected Discharge Plan:  Home/Self Care  In-House Referral:     Discharge planning Services  CM Consult  Post Acute Care Choice:    Choice offered to:     DME Arranged:    DME Agency:     HH Arranged:    HH Agency:     Status of Service:  In process, will continue to follow  Medicare Important Message Given:    Date Medicare IM Given:    Medicare IM give by:    Date Additional Medicare IM Given:    Additional Medicare Important Message give by:     If discussed at Long Length of Stay Meetings, dates discussed:    Additional Comments:  Leone Havenaylor, Cedric Denison Clinton, RN 08/18/2015, 6:37 PM

## 2015-08-19 DIAGNOSIS — R918 Other nonspecific abnormal finding of lung field: Secondary | ICD-10-CM

## 2015-08-19 DIAGNOSIS — J189 Pneumonia, unspecified organism: Secondary | ICD-10-CM | POA: Diagnosis not present

## 2015-08-19 DIAGNOSIS — Z8709 Personal history of other diseases of the respiratory system: Secondary | ICD-10-CM

## 2015-08-19 DIAGNOSIS — F1910 Other psychoactive substance abuse, uncomplicated: Secondary | ICD-10-CM | POA: Diagnosis not present

## 2015-08-19 DIAGNOSIS — I10 Essential (primary) hypertension: Secondary | ICD-10-CM | POA: Diagnosis not present

## 2015-08-19 DIAGNOSIS — F1199 Opioid use, unspecified with unspecified opioid-induced disorder: Secondary | ICD-10-CM | POA: Diagnosis not present

## 2015-08-19 DIAGNOSIS — K219 Gastro-esophageal reflux disease without esophagitis: Secondary | ICD-10-CM | POA: Diagnosis not present

## 2015-08-19 LAB — CBC WITH DIFFERENTIAL/PLATELET
BASOS ABS: 0 10*3/uL (ref 0.0–0.1)
BASOS PCT: 0 %
Eosinophils Absolute: 0.2 10*3/uL (ref 0.0–0.7)
Eosinophils Relative: 2 %
HEMATOCRIT: 43.5 % (ref 36.0–46.0)
Hemoglobin: 14.2 g/dL (ref 12.0–15.0)
Lymphocytes Relative: 29 %
Lymphs Abs: 2.8 10*3/uL (ref 0.7–4.0)
MCH: 33.1 pg (ref 26.0–34.0)
MCHC: 32.6 g/dL (ref 30.0–36.0)
MCV: 101.4 fL — ABNORMAL HIGH (ref 78.0–100.0)
MONO ABS: 0.7 10*3/uL (ref 0.1–1.0)
Monocytes Relative: 7 %
Neutro Abs: 6.1 10*3/uL (ref 1.7–7.7)
Neutrophils Relative %: 62 %
Platelets: 302 10*3/uL (ref 150–400)
RBC: 4.29 MIL/uL (ref 3.87–5.11)
RDW: 13.9 % (ref 11.5–15.5)
WBC: 9.8 10*3/uL (ref 4.0–10.5)

## 2015-08-19 LAB — SEDIMENTATION RATE: Sed Rate: 16 mm/hr (ref 0–22)

## 2015-08-19 LAB — PROCALCITONIN

## 2015-08-19 LAB — C-REACTIVE PROTEIN: CRP: 1.5 mg/dL — AB (ref <1.0)

## 2015-08-19 MED ORDER — OXYMETAZOLINE HCL 0.05 % NA SOLN
1.0000 | Freq: Two times a day (BID) | NASAL | Status: DC
Start: 2015-08-19 — End: 2015-08-20
  Administered 2015-08-19 – 2015-08-20 (×2): 1 via NASAL
  Filled 2015-08-19: qty 15

## 2015-08-19 MED ORDER — PREDNISONE 20 MG PO TABS
40.0000 mg | ORAL_TABLET | Freq: Every day | ORAL | Status: DC
  Administered 2015-08-20: 40 mg via ORAL
  Filled 2015-08-19: qty 2

## 2015-08-19 MED ORDER — SALINE SPRAY 0.65 % NA SOLN
1.0000 | NASAL | Status: DC | PRN
  Filled 2015-08-19: qty 44

## 2015-08-19 MED ORDER — FLUTICASONE PROPIONATE 50 MCG/ACT NA SUSP
2.0000 | Freq: Every day | NASAL | Status: DC
  Administered 2015-08-19 – 2015-08-20 (×2): 2 via NASAL
  Filled 2015-08-19: qty 16

## 2015-08-19 MED ORDER — PANTOPRAZOLE SODIUM 40 MG PO TBEC
40.0000 mg | DELAYED_RELEASE_TABLET | Freq: Two times a day (BID) | ORAL | Status: DC
  Administered 2015-08-19 – 2015-08-20 (×2): 40 mg via ORAL
  Filled 2015-08-19 (×2): qty 1

## 2015-08-19 NOTE — Evaluation (Signed)
Physical Therapy Evaluation Patient Details Name: Susan Bryan MRN: [id number] DOB: [birth year] Today's Date: 08/19/2015   History of Present Illness  44 yo female with onset of CAP, history of bipolar schizophrenia and new rib pain from cough  Clinical Impression  Pt was able to walk with PT and maintain her O2 sats, with no balance issues noted.  Pt is planning to return home and since she is indp for gait and maintains O2, no further goals for PT intervention are needed.  However if pt is more debilitated in the future then PT will return to see what can be offered.    Follow Up Recommendations No PT follow up    Equipment Recommendations  None recommended by PT    Recommendations for Other Services       Precautions / Restrictions Precautions Precautions: None Restrictions Weight Bearing Restrictions: No      Mobility  Bed Mobility Overal bed mobility: Modified Independent                Transfers Overall transfer level: Modified independent                  Ambulation/Gait Ambulation/Gait assistance: Modified independent (Device/Increase time) Ambulation Distance (Feet): 200 Feet Assistive device: None (supervised for safety) Gait Pattern/deviations: Step-through pattern;Narrow base of support;Trunk flexed (frequent stops with coughing fits) Gait velocity: slower Gait velocity interpretation: Below normal speed for age/gender    Stairs            Wheelchair Mobility    Modified Rankin (Stroke Patients Only)       Balance Overall balance assessment: Modified Independent                                           Pertinent Vitals/Pain Pain Assessment: Faces Pain Score: 6  Faces Pain Scale: Hurts even more Pain Location: ribs with cough Pain Descriptors / Indicators: Cramping Pain Intervention(s): Monitored during session    Home Living Family/patient expects to be discharged to:: Private residence Living  Arrangements: Non-relatives/Friends Available Help at Discharge: Friend(s);Available 24 hours/day Type of Home: House       Home Layout: One level Home Equipment: None      Prior Function Level of Independence: Independent               Hand Dominance        Extremity/Trunk Assessment   Upper Extremity Assessment: Overall WFL for tasks assessed           Lower Extremity Assessment: Overall WFL for tasks assessed      Cervical / Trunk Assessment: Normal  Communication   Communication: No difficulties  Cognition Arousal/Alertness: Awake/alert Behavior During Therapy: WFL for tasks assessed/performed Overall Cognitive Status: Within Functional Limits for tasks assessed                      General Comments General comments (skin integrity, edema, etc.): Pt was assessed for pulse and O2 sats, with pregait sat 98% with pulse 66, postgait pulse and sat 73 and 100%.      Exercises        Assessment/Plan    PT Assessment Patent does not need any further PT services  PT Diagnosis Acute pain   PT Problem List    PT Treatment Interventions     PT Goals (Current goals can be found  in the Care Plan section) Acute Rehab PT Goals Patient Stated Goal: to feel better PT Goal Formulation: All assessment and education complete, DC therapy Time For Goal Achievement: 08/19/15 Potential to Achieve Goals: Good    Frequency     Barriers to discharge        Co-evaluation               End of Session   Activity Tolerance: Other (comment) (coughing and discomfort in ribs are her only limits) Patient left: in bed;with call bell/phone within reach;with nursing/sitter in room Nurse Communication: Other (comment);Mobility status (O2 sats and performance)    Functional Assessment Tool Used: clinical judgment Functional Limitation: Mobility: Walking and moving around Mobility: Walking and Moving Around Current Status [phone]): At least 1 percent but less  than 20 percent impaired, limited or restricted Mobility: Walking and Moving Around Goal Status [phone]): At least 1 percent but less than 20 percent impaired, limited or restricted Mobility: Walking and Moving Around Discharge Status [phone]): At least 1 percent but less than 20 percent impaired, limited or restricted    Time: 1525-1546 PT Time Calculation (min) (ACUTE ONLY): 21 min   Charges:   PT Evaluation $Initial PT Evaluation Tier I: 1 Procedure     PT G Codes:   PT G-Codes **NOT FOR INPATIENT CLASS** Functional Assessment Tool Used: clinical judgment Functional Limitation: Mobility: Walking and moving around Mobility: Walking and Moving Around Current Status ([phone]): At least 1 percent but less than 20 percent impaired, limited or restricted Mobility: Walking and Moving Around Goal Status [phone]): At least 1 percent but less than 20 percent impaired, limited or restricted Mobility: Walking and Moving Around Discharge Status [phone]([phone]): At least 1 percent but less than 20 percent impaired, limited or restricted    Ivar DrapeStout, Jerlean Peralta E 08/19/2015, 3:58 PM   Samul Dadauth Keiran Sias, PT MS Acute Rehab Dept. Number: [facility] [id number] and [facility] [phone]

## 2015-08-19 NOTE — Progress Notes (Signed)
Internal Medicine Attending:   I saw and examined the patient. I reviewed the resident's note and I agree with the resident's findings and plan as documented in the resident's note.  CT chest showed diffuse patchy ground-glass opacities. Clinically she is moderately improved today, able to ambulate in the hallway. Cough remains constant and non-productive. No oxygen requirement at rest, we will ambulate her with oximetry as well. Differential for the ground glass is broad and includes hypersensitivity pneumonitis, DIP, NSIP, and a rheumatologic associated lung disease. It seems less likely to me that this is explained by community acquired pneumonia, but we will complete a course of antibiotics for this possibility. 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 No other clear environmental exposures now. Our plan is to consult pulmonology to consider either conservative management with trial of antibiotics, steroids, and smoking cessation, or do we need more aggressive diagnostics including bronchoscopy with biopsy.

## 2015-08-19 NOTE — Consult Note (Signed)
Name: Susan Bryan [facility] MRN: [id number] DOB: February 15, 1971    ADMISSION DATE:  08/16/2015 CONSULTATION DATE:  08/19/2015  REFERRING MD :  Evette Doffing - IMTS  CHIEF COMPLAINT:  SOB  BRIEF PATIENT DESCRIPTION: 44 year old female who presented 08/16/2015 complaining of shortness of breath and nonproductive cough. Initial chest x-ray concerning for atypical pneumonia. CT chest was obtained showed bilateral patchy GGO. PCCM to see  SIGNIFICANT EVENTS  10/10 > to ED with URI symptoms. CXR negative. Given solumedrol and discharged with albuterol without relief.  10/15 > Admit for presumed atypical PNA based on CXR 10/18 > PCCM consult  STUDIES:  10/17 CT chest > Bilateral patchy ground-glass opacities throughout the lungs. Differential diagnosis includes an infectious or inflammatory etiology such as idiopathic interstitial pneumonitis versus hypersensitivity pneumonitis versus alveolar edema   HISTORY OF PRESENT ILLNESS:  44 year old female with past medical history as below, which includes hypertension, hepatitis C, bipolar disorder, polysubstance abuse including IV drugs and cocaine. She is on methadone due to heroin and opioid abuse.  She initially presented to [facility] emergency department 08/11/2015 complaining of URI symptoms. Chest x-ray at that time was negative. She was presumed to be having an asthma exacerbation and  was given a shot of Solu-Medrol and discharged with albuterol inhaler which has provided her with no relief. 08/16/2015 she again presented to the emergency department complaining of one-week history of nonproductive cough, chills, dyspnea on exertion, and diaphoresis. In the ED her vitals are stable although O2 sat was 94% on room air. Chest x-ray showed development of bilateral opacifications consistent with atypical pneumonia. She was admitted and treated with IV ceftriaxone and azithromycin which is since been changed to doxycycline in the setting of prolonged QTC (this has  since resolved). She was also initiated on prednisone therapy. 08/18/2015 CT chest was obtained and demonstrated bilateral patchy groundglass opacifications. PCCM has been asked to see. 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 Has not worked at either of these places for 10 years. She has no pets at home. She has a 30 pack year smoking history. She has recently used crack/cocaine by way of smoking, tired this short term and did not like it. She also complains of chest burning which has been constant with no aggravating/alleviated factors. Does have GERD and takes omeprazole and feels that this is pretty well controlled, however, she will occasionally awake from sleep with throat burning. Also complains of sinus congestion. No family history of autoimmune disease. She has lived in trailer with questionable definite mold/allergen exposures.    PAST MEDICAL HISTORY :   has a past medical history of Hypertension; Bipolar 1 disorder ([place]); Hepatitis C; GERD (gastroesophageal reflux disease); Polysubstance abuse; Family history of adverse reaction to anesthesia; and Pneumonia (08/2015).  has past surgical history that includes Vascular surgery; Appendectomy; and Abdominal hysterectomy. Prior to Admission medications   Medication Sig Start Date End Date Taking? Authorizing Provider  albuterol (PROVENTIL) (2.5 MG/3ML) 0.083% nebulizer solution Take 3 mLs (2.5 mg total) by nebulization every 6 (six) hours as needed for wheezing or shortness of breath. 08/11/15  Yes Jeffrey Hedges, PA-C  Aspirin-Acetaminophen (GOODYS BODY PAIN PO) Take 1 tablet by mouth 3 (three) times daily as needed (back pain).   Yes Historical Provider, MD  gabapentin (NEURONTIN) 300 MG capsule Take 300 mg by mouth 2 (two) times daily. 04/14/15  Yes Historical Provider, MD  lisinopril (PRINIVIL,ZESTRIL) 10 MG tablet Take 10 mg by mouth  daily.   Yes Historical Provider, MD  methadone (DOLOPHINE)  5 MG tablet Take 120 mg by mouth daily.   Yes Historical Provider, MD  omeprazole (PRILOSEC) 20 MG capsule Take 20 mg by mouth daily.   Yes Historical Provider, MD  traZODone (DESYREL) 100 MG tablet Take 100 mg by mouth at bedtime. 02/26/15  Yes Historical Provider, MD  hydrOXYzine (ATARAX/VISTARIL) 25 MG tablet Take 1 tablet (25 mg total) by mouth every 6 (six) hours. Patient not taking: Reported on 08/11/2015 06/16/15   Margarita Mail, PA-C  ibuprofen (ADVIL,MOTRIN) 800 MG tablet Take 1 tablet (800 mg total) by mouth every 8 (eight) hours as needed for mild pain or moderate pain. Patient not taking: Reported on 05/15/2015 12/25/14   Clayton Bibles, PA-C  naproxen (NAPROSYN) 500 MG tablet Take 1 tablet (500 mg total) by mouth 2 (two) times daily. Patient not taking: Reported on 05/15/2015 11/04/14   Comer Locket, PA-C  naproxen (NAPROSYN) 500 MG tablet Take 1 tablet (500 mg total) by mouth 2 (two) times daily as needed for mild pain, moderate pain or headache (TAKE WITH MEALS.). Patient not taking: Reported on 08/11/2015 05/15/15   Mercedes Camprubi-Soms, PA-C  oxyCODONE-acetaminophen (PERCOCET/ROXICET) 5-325 MG per tablet Take 1 tablet by mouth every 6 (six) hours as needed for severe pain. Patient not taking: Reported on 05/15/2015 03/16/15   Linton Flemings, MD  predniSONE (DELTASONE) 20 MG tablet 3 tabs po daily x 3 days, then 2 tabs x 3 days, then 1.5 tabs x 3 days, then 1 tab x 3 days, then 0.5 tabs x 3 days Patient not taking: Reported on 08/11/2015 06/16/15   Margarita Mail, PA-C  traMADol (ULTRAM) 50 MG tablet Take 1 tablet (50 mg total) by mouth every 6 (six) hours as needed. Patient not taking: Reported on 08/11/2015 05/23/15   Larene Pickett, PA-C   No Known Allergies  FAMILY HISTORY:  family history includes Heart disease in her father and mother. SOCIAL HISTORY:  reports that she has been smoking Cigarettes.  She has a 20 pack-year smoking history. She has never used smokeless tobacco. She  reports that she drinks alcohol. She reports that she does not use illicit drugs.  REVIEW OF SYSTEMS:   Bolds are positive  Constitutional: weight loss, gain, night sweats, Fevers, chills, fatigue .  HEENT: headaches, Sore throat, sneezing, nasal congestion, post nasal drip, Difficulty swallowing, Tooth/dental problems, visual complaints visual changes, ear ache CV:  chest pain, radiates: ,Orthopnea, PND, swelling in lower extremities, dizziness, palpitations, syncope.  GI  heartburn, indigestion, abdominal pain, nausea, vomiting, diarrhea, change in bowel habits, loss of appetite, bloody stools.  Resp: cough, non-productive: , hemoptysis, dyspnea worse on exertion, chest pain, pleuritic.  Skin: rash or itching or icterus GU: dysuria, change in color of urine, urgency or frequency. flank pain, hematuria  MS: joint pain or swelling. decreased range of motion  Psych: change in mood or affect. depression or anxiety.  Neuro: difficulty with speech, weakness, numbness, ataxia    SUBJECTIVE:   VITAL SIGNS: Temp:  [97.7 F (36.5 C)-98.2 F (36.8 C)] 97.7 F (36.5 C) (10/18 0615) Pulse Rate:  [58-68] 68 (10/18 0615) Resp:  [18-22] 18 (10/18 0615) BP: (163-173)/(85-107) 166/86 mmHg (10/18 0659) SpO2:  [93 %-99 %] 93 % (10/18 1213)  PHYSICAL EXAMINATION: General:  Overweight female in NAD Neuro:  Alert,oriented, non-focal HEENT:  [place]/AT, no appreciable JVD Cardiovascular:  RRR, no MRG Lungs:  Bilateral rales Abdomen:  Soft, non-tender, non-distended Musculoskeletal:  No acute  deformity or ROM limitation Skin:  Grossly intact   Recent Labs Lab 08/16/15 1100  NA 141  K 3.1*  CL 105  CO2 28  BUN <5*  CREATININE 0.63  GLUCOSE 93    Recent Labs Lab 08/16/15 1100  HGB 14.9  HCT 44.1  WBC 16.8*  PLT 274   Ct Chest Wo Contrast  08/18/2015  CLINICAL DATA:  Rule out pneumonia, chest pain EXAM: CT CHEST WITHOUT CONTRAST TECHNIQUE: Multidetector CT imaging of the chest was  performed following the standard protocol without IV contrast. COMPARISON:  Chest x-ray 08/16/2015 FINDINGS: The central airways are patent. There are patchy bilateral ground-glass opacities in the bilateral upper lobes, bilateral lower lobes and right middle lobe. There is no pleural effusion or pneumothorax. There are no pathologically enlarged axillary, hilar or mediastinal lymph nodes. The heart size is normal. There is coronary artery atherosclerosis in the LAD, circumflex and RCA. There is no pericardial effusion. The thoracic aorta is normal in caliber. Review of bone windows demonstrates no focal lytic or sclerotic lesions. Limited non-contrast images of the upper abdomen were obtained. The adrenal glands appear normal. The remainder of the visualized abdominal organs are unremarkable. IMPRESSION: 1. Bilateral patchy ground-glass opacities throughout the lungs. Differential diagnosis includes an infectious or inflammatory etiology such as idiopathic interstitial pneumonitis versus hypersensitivity pneumonitis versus alveolar edema. Electronically Signed   By: Kathreen Devoid   On: 08/18/2015 12:44    ASSESSMENT / PLAN:  Diffuse pulmonary infiltrates  Tobacco abuse disorder  Discussion: Certainly could represent atypical PNA in setting of leukocytosis/fevers, also concern for autoimmune etiology such as RA or lupus. GERD with aspiration could also play role. ddx also includes crack lung in setting of recent crack use.   - Supplemental O2 as needed to keep SpO2 > 92% - Continue CAP/atypical PNA coverage with ceftriaxone and doxycycline (azithro d/c'd due to prolonged QTc) - Continue prednisone - Send autoimmune workup (ESR, CRP, ACE, RA factor, lupus panel, ana, anca)  - HIV negative - Repeat CBC - Repeat CXR in AM - Check PCT - Protonix to BID - Nasal/sinus hygiene (saline, nasal steroids, afrin x 2 days) - Ambulatory desaturation assessment prior to discharge - Defer bronchoscopy/biopsy  until initial workup complete - Will need outpatient pulmonary follow up - Smoking cessation counseled  Georgann Housekeeper, AGACNP-BC [place] [facility]/Critical [facility] Pager [phone] or [phone]  08/19/2015 2:10 PM  Attending Note:  44 year old female with extensive drug abuse history as well as extensive occupational history who presents to the pulmonary service for hypoxemia, pneumonia and pulmonary infiltrate. Coarse BS diffusely with end exp wheezes on exam. I reviewed chest CT myself, GGO in all lung fields. Discussed with PCCM-NP and patient's family.  Hypoxemia:  - Supplement O2 for sat of 88-92%.  - Hopefully demand will decrease enough that she will not need home O2.  Pneumonia: seem atypical in nature.  - Rocephin for 8 days, may switch to augmentin if discharging.  - Doxy for atypical coverage.  - F/U on culture.  - PCT f/u ordered.  Pulmonary infiltrate: Doubt this is a first manifestation of ILD.  More likely to be an infection vs crack lung.  - Will send an auto-immune panel as ordered.  - Continue steroids with a taper for 1 wk.  - Will need pulmonary f/u with full PFT in one month.  Smoking: 30 pack years and wheezing on exam.  - Smoking cessation.  Wheezing: suspect a reactive airway disease component with  cigarette and MJ exposure as well as smoking crack.  - Smoking cessation.  - Will need PFTs to determine if patient has an obstructive component.  - Steroids as above.  - Albuterol as needed.  Patient seen and examined, agree with above note.  I dictated the care and orders written for this patient under my direction.  Rush Farmer, MD [phone]

## 2015-08-19 NOTE — Care Management Note (Addendum)
Case Management Note  Patient Details  Name: Susan Bryan MRN: [id number] Date of Birth: July 27, 1971  Subjective/Objective:    Date:  08/19/15 Spoke with patient at the bedside along with friend. Introduced self as Sports coachcase manager and explained role in discharge planning and how to be reached. Verified patient lives in town with roommate. Expressed no potential need for no other DME. Verified patient anticipates to go home with friend at time of discharge and will have full-time supervision by friend at this time to best of their knowledge. Patient  denied needing help with their medication, but states her bipolar med , latuda is too high , NCM gave her a discound coupon card to try to use,   Patient is driven by friend to MD appointments. Verified patient has PCP [facility] in [place]. Patient states her and her friend [place] living in [place] a friend, she will not be able to get a place until the first of the month.  Her friend is letting her stay with her because otherwise she will be staying in her car.    Plan: CM will continue to follow for discharge planning and [facility].                 Action/Plan:   Expected Discharge Date:                  Expected Discharge Plan:  Home/Self Care  In-House Referral:     Discharge planning Services  CM Consult  Post Acute Care Choice:    Choice offered to:     DME Arranged:    DME Agency:     HH Arranged:    HH Agency:     Status of Service:  Completed, signed off  Medicare Important Message Given:    Date Medicare IM Given:    Medicare IM give by:    Date Additional Medicare IM Given:    Additional Medicare Important Message give by:     If discussed at Long Length of Stay Meetings, dates discussed:    Additional Comments:  Susan Bryan, Susan Campanaro [place], RN 08/19/2015, 11:42 AM

## 2015-08-19 NOTE — Progress Notes (Signed)
Subjective: [name] is a 44yo F with PMH HTN, bipolar disorder, schizophrenia, untreated HCV, polysubstance abuse (tobacco, heroin/IVDU, opioids, cocaine), and 2 prior episodes of pneumonia here for 1 week of non-productive cough, subjective fevers and chills, and shortness of breath. She still has dry cough that causes her significant rib pain which she says is improving. She is able to ambulate down the hallways this morning which she previously couldn't do due to her shortness of breath. Otherwise she has no other issues at this time. Objective: Vital signs in last 24 hours: Filed Vitals:   08/19/15 0046 08/19/15 0615 08/19/15 0659 08/19/15 0801  BP:  173/107 166/86   Pulse: 64 68    Temp:  97.7 F (36.5 C)    TempSrc:  Oral    Resp: 18 18    Height:      Weight:      SpO2:  93%  93%   Weight change:   Intake/Output Summary (Last 24 hours) at 08/19/15 1059 Last data filed at 08/18/15 1428  Gross per 24 hour  Intake    360 ml  Output      0 ml  Net    360 ml   General appearance: alert and no distress Lungs: bilateral rales in both inspiration and expiration auscultated. No wheezes or rhonchi heard. Normal respiratory effort.  Heart: regular rate and rhythm, S1, S2 normal, no murmur, click, rub or gallop Abdomen: soft, non-tender; bowel sounds normal; no masses,  no organomegaly Extremities: extremities normal, atraumatic, no cyanosis or edema Pulses: 2+ and symmetric Skin: Skin color, texture, turgor normal. No rashes or lesions Neurologic: Alert and oriented X 3, normal strength and tone. Normal symmetric reflexes. Normal coordination and gait   Lab Results: No new labs  Assessment/Plan: Principal Problem:   Community acquired pneumonia Active Problems:   Hypertension   Polysubstance abuse   Bipolar 1 disorder (HCC)   Hepatitis C   Personal history of asthma  Pneumonia - Patient presents with 1 week of dry cough, subjective fevers, and shortness of breath  initially thought to be an asthma exacerbation with negative CXR on 10/10, with worsening course with no improvement using albuterol over the past week. CXR on 10/15 shows heterogeneous opacities bilaterally most consistent with multifocal infection superimposed on emphesematous changes. She does have a history of similar symptoms 2 and 3 years ago, respectively. Currently, still has nonproductive cough, but otherwise afebrile, hemodynamically stable and satting in the mid-90's on room air in no apparent respiratory distress. Strep pneumoniae urine antigen negative. CT chest on 10/17 showed bilateral ground glass opacities, which is nonspecific and may simply represent atypical pneumonia but does seem severe for a simple pneumonia and may represent other etiologies such as various interstitial lung diseases, rheumatologic disorders, or hypersensitivity pneumonitis. 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  -Continue IV ceftriaxone and PO doxycycline (switched from azithromycin 2/2 long QTc of 637 on initial EKG), now day 4 -Mucinex DM 30-600mg  PO BID -Tessalon PRN for continued cough -Duonebs q6h -Prednisone 40mg  today, continue to taper -Incentive spirometry -Pertussis antigen -Naproxen PRN for breakthrough post-tussive pain and headache -F/u BCx and sputum cultures -Consult pulmonology for rec's regarding further workup/follow-up for CT findigs -Obtain rheumatologic panel - patient says she had a blood clot in her lungs when she was 44  years old, and has occasional easy bruising, but the remainder of a rheumatologic ROS was negative  Polysubstance abuse - Patient currently on methadone  QD (dose ~9am daily) for heroin/opioid abuse treatment, follows with [facility]. -Methadone per pharmacy -Repeat EKG yesterday for  initial long QTc showed QTc in  420s  HTN -Lisinopril  QD  GERD -Continue home pantoprazole  QD  PPX -DVT: Lovenox  Dispo: Disposition is deferred at this time, awaiting improvement of current medical problems.  Anticipated discharge in approximately 1 day(s).   Darrick Huntsman, MD 08/19/2015, 10:59 AM

## 2015-08-19 NOTE — Progress Notes (Deleted)
Subjective: Ms. Cyndee BrightlyBlue is a 44yo F with PMH HTN, bipolar disorder, schizophrenia, untreated HCV, polysubstance abuse (tobacco, heroin/IVDU, opioids, cocaine), and 2 prior episodes of pneumonia here for 1 week of non-productive cough, subjective fevers and chills, and shortness of breath. She still has dry cough that causes her significant rib pain which she says is improving. She is able to ambulate down the hallways this morning which she previously couldn't do due to her shortness of breath. Otherwise she has no other issues at this time. Objective: Vital signs in last 24 hours: Filed Vitals:   08/19/15 0046 08/19/15 0615 08/19/15 0659 08/19/15 0801  BP:  173/107 166/86   Pulse: 64 68    Temp:  97.7 F (36.5 C)    TempSrc:  Oral    Resp: 18 18    Height:      Weight:      SpO2:  93%  93%   Weight change:   Intake/Output Summary (Last 24 hours) at 08/19/15 1058 Last data filed at 08/18/15 1428  Gross per 24 hour  Intake    360 ml  Output      0 ml  Net    360 ml   General appearance: alert and no distress Lungs: bilateral rales in both inspiration and expiration auscultated. No wheezes or rhonchi heard. Normal respiratory effort.  Heart: regular rate and rhythm, S1, S2 normal, no murmur, click, rub or gallop Abdomen: soft, non-tender; bowel sounds normal; no masses,  no organomegaly Extremities: extremities normal, atraumatic, no cyanosis or edema Pulses: 2+ and symmetric Skin: Skin color, texture, turgor normal. No rashes or lesions Neurologic: Alert and oriented X 3, normal strength and tone. Normal symmetric reflexes. Normal coordination and gait   Lab Results: No new labs  Assessment/Plan: Principal Problem:   Community acquired pneumonia Active Problems:   Hypertension   Polysubstance abuse   Bipolar 1 disorder (HCC)   Hepatitis C   Personal history of asthma  Pneumonia - Patient presents with 1 week of dry cough, subjective fevers, and shortness of breath  initially thought to be an asthma exacerbation with negative CXR on 10/10, with worsening course with no improvement using albuterol over the past week. CXR on 10/15 shows heterogeneous opacities bilaterally most consistent with multifocal infection superimposed on emphesematous changes. She does have a history of similar symptoms 2 and 3 years ago, respectively. Currently, still has nonproductive cough, but otherwise afebrile, hemodynamically stable and satting in the mid-90's on room air in no apparent respiratory distress. Strep pneumoniae urine antigen negative. CT chest on 10/17 showed bilateral ground glass opacities, which is nonspecific and may simply represent atypical pneumonia but does seem severe for a simple pneumonia and may represent other etiologies such as various interstitial lung diseases, rheumatologic disorders, or hypersensitivity pneumonitis. 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  -Continue IV ceftriaxone and PO doxycycline (switched from azithromycin 2/2 long QTc of 637 on initial EKG), now day 4 -Mucinex DM 30-600mg  PO BID -Tessalon PRN for continued cough -Duonebs q6h -Prednisone 40mg  today, continue to taper -Incentive spirometry -Pertussis antigen -Naproxen PRN for breakthrough post-tussive pain and headache -F/u BCx and sputum cultures -Consult pulmonology for rec's regarding further workup/follow-up for CT findigs  Polysubstance abuse - Patient currently on methadone 120mg  QD (dose ~9am daily) for heroin/opioid abuse treatment, follows  with [facility]. -Methadone per pharmacy -Repeat EKG yesterday for  initial long QTc showed QTc in 420s  HTN -Lisinopril  QD  GERD -Continue home pantoprazole  QD  PPX -DVT: Lovenox  Dispo: Disposition is deferred at this time, awaiting improvement of current medical problems.   Anticipated discharge in approximately 1 day(s).   Darrick Huntsman, MD 08/19/2015, 10:58 AM

## 2015-08-20 ENCOUNTER — Observation Stay (HOSPITAL_COMMUNITY): Payer: Medicare HMO

## 2015-08-20 DIAGNOSIS — J189 Pneumonia, unspecified organism: Secondary | ICD-10-CM | POA: Diagnosis not present

## 2015-08-20 LAB — ANGIOTENSIN CONVERTING ENZYME: Angiotensin-Converting Enzyme: <15 U/L (ref 14–82)

## 2015-08-20 LAB — ANCA TITERS: P-ANCA: <1:20 {titer}

## 2015-08-20 LAB — EXTRACTABLE NUCLEAR ANTIGEN ANTIBODY
ENA SM Ab Ser-aCnc: <0.2 AI (ref 0.0–0.9)
Ribonucleic Protein: <0.2 AI (ref 0.0–0.9)

## 2015-08-20 LAB — RHEUMATOID FACTOR: Rhuematoid fact SerPl-aCnc: <10 IU/mL (ref 0.0–13.9)

## 2015-08-20 MED ORDER — GUAIFENESIN-DM 100-10 MG/5ML PO SYRP: 10.0000 mL | ORAL_SOLUTION | ORAL | Status: DC | PRN

## 2015-08-20 MED ORDER — PREDNISONE 20 MG PO TABS: 40.0000 mg | ORAL_TABLET | Freq: Every day | ORAL | Status: DC

## 2015-08-20 MED ORDER — DOXYCYCLINE HYCLATE 100 MG PO TABS: 100.0000 mg | ORAL_TABLET | Freq: Two times a day (BID) | ORAL | Status: DC

## 2015-08-20 MED ORDER — BENZONATATE 100 MG PO CAPS: 100.0000 mg | ORAL_CAPSULE | Freq: Two times a day (BID) | ORAL | Status: DC | PRN

## 2015-08-20 MED ORDER — AMOXICILLIN-POT CLAVULANATE 875-125 MG PO TABS: 1.0000 | ORAL_TABLET | Freq: Two times a day (BID) | ORAL | Status: DC

## 2015-08-20 NOTE — Discharge Summary (Signed)
Name: Susan Bryan [facility] MRN: [id number] DOB: 24-Sep-1971 44 y.o. PCP: Pcp Not In System  Date of Admission: 08/16/2015  8:38 AM Date of Discharge: 08/20/2015 Attending Physician: Tyson Alias, MD  Discharge Diagnosis: 1. Atypical community acquired pneumonia  Principal Problem:   Community acquired pneumonia Active Problems:   Hypertension   Polysubstance abuse   Bipolar 1 disorder (HCC)   Hepatitis C   Personal history of asthma  Discharge Medications:   Medication List    TAKE these medications        amoxicillin-clavulanate 875-125 MG tablet  Commonly known as:  AUGMENTIN  Take 1 tablet by mouth 2 (two) times daily.     benzonatate 100 MG capsule  Commonly known as:  TESSALON  Take 1 capsule (100 mg total) by mouth 2 (two) times daily as needed for cough.     doxycycline 100 MG tablet  Commonly known as:  VIBRA-TABS  Take 1 tablet (100 mg total) by mouth every 12 (twelve) hours.     guaiFENesin-dextromethorphan 100-10 MG/5ML syrup  Commonly known as:  ROBITUSSIN DM  Take 10 mLs by mouth every 4 (four) hours as needed for cough.     predniSONE 20 MG tablet  Commonly known as:  DELTASONE  Take 2 tablets (40 mg total) by mouth daily with breakfast.      ASK your doctor about these medications        albuterol (2.5 MG/3ML) 0.083% nebulizer solution  Commonly known as:  PROVENTIL  Take 3 mLs (2.5 mg total) by nebulization every 6 (six) hours as needed for wheezing or shortness of breath.     gabapentin 300 MG capsule  Commonly known as:  NEURONTIN  Take 300 mg by mouth 2 (two) times daily.     GOODYS BODY PAIN PO  Take 1 tablet by mouth 3 (three) times daily as needed (back pain).     hydrOXYzine 25 MG tablet  Commonly known as:  ATARAX/VISTARIL  Take 1 tablet (25 mg total) by mouth every 6 (six) hours.     ibuprofen 800 MG tablet  Commonly known as:  ADVIL,MOTRIN  Take 1 tablet (800 mg total) by mouth every 8 (eight) hours as needed for mild  pain or moderate pain.     lisinopril 10 MG tablet  Commonly known as:  PRINIVIL,ZESTRIL  Take 10 mg by mouth daily.     methadone 5 MG tablet  Commonly known as:  DOLOPHINE  Take 120 mg by mouth daily.     naproxen 500 MG tablet  Commonly known as:  NAPROSYN  Take 1 tablet (500 mg total) by mouth 2 (two) times daily.     naproxen 500 MG tablet  Commonly known as:  NAPROSYN  Take 1 tablet (500 mg total) by mouth 2 (two) times daily as needed for mild pain, moderate pain or headache (TAKE WITH MEALS.).     omeprazole 20 MG capsule  Commonly known as:  PRILOSEC  Take 20 mg by mouth daily.     oxyCODONE-acetaminophen 5-325 MG tablet  Commonly known as:  PERCOCET/ROXICET  Take 1 tablet by mouth every 6 (six) hours as needed for severe pain.     traMADol 50 MG tablet  Commonly known as:  ULTRAM  Take 1 tablet (50 mg total) by mouth every 6 (six) hours as needed.     traZODone 100 MG tablet  Commonly known as:  DESYREL  Take 100 mg by mouth at bedtime.  Disposition and follow-up:   SusanSusan Bryan [facility] St[facility].  At the hospital follow up visit please address:  1.  Resolution/improvement of respiratory symptoms with antibiotic course and steroids  2.  Labs / imaging needed at time of follow-up: CXR in 6 weeks from d/c  3.  Pending labs/ test needing follow-up: Follow-up rheumatology panel  Follow-up Appointments: Follow-up Information    Follow up with ASRES,ALEHEGN, MD On 08/25/2015.   Specialty:  Family Medicine   Why:  1200 noon for hospital follow up , this is your pcp   Contact information:   [address] [place] [id number] [phone]       Follow up with [facility] In 1 month.      Discharge Instructions: Finish abx and steroid course, follow-up with PCP and pulmonary  Consultations:   Pulmonology  Procedures Performed:  Dg Chest 2 View  08/16/2015   CLINICAL DATA:  Nonproductive cough and shortness of breath for 1 week. History of smoking. EXAM: CHEST  2 VIEW COMPARISON:  08/11/2015; 06/12/2014; 10/23/2014; chest CT - 10/23/2012 FINDINGS: Grossly unchanged cardiac silhouette and mediastinal contours. The lungs remain hyperexpanded with thinning of the biapical pulmonary parenchyma. Interval development of extensive ill-defined heterogeneous opacities within the bilateral lung bases. No pneumothorax. There is a minimal amount of pleural parenchymal thickening about the bilateral major and the right minor fissures. No pleural effusion. No acute osseus abnormalities. IMPRESSION: Interval development of extensive ill-defined heterogeneous within the bilateral lung bases, predominantly within the right middle lobe and lingula, nonspecific though favored to represent multi focal atypical infection superimposed on emphysematous change. A follow-up chest radiograph in 3 to 4 weeks after treatment is recommended to ensure resolution. Electronically Signed   By: Simonne ComeJohn  Watts M.D.   On: 08/16/2015 10:03   Dg Chest 2 View  08/11/2015  CLINICAL DATA:  Cough EXAM: CHEST  2 VIEW COMPARISON:  06/12/2014 chest rate FINDINGS: Stable cardiomediastinal silhouette with normal heart size. No pneumothorax. No pleural effusion. Clear lungs, with no focal lung consolidation and no pulmonary edema. IMPRESSION: No active cardiopulmonary disease. Electronically Signed   By: Delbert PhenixJason A Poff M.D.   On: 08/11/2015 12:17   Ct Chest Wo Contrast  08/18/2015  CLINICAL DATA:  Rule out pneumonia, chest pain EXAM: CT CHEST WITHOUT CONTRAST TECHNIQUE: Multidetector CT imaging of the chest was performed following the standard protocol without IV contrast. COMPARISON:  Chest x-ray 08/16/2015 FINDINGS: The central airways are patent. There are patchy bilateral ground-glass opacities in the bilateral upper lobes, bilateral lower lobes and right middle lobe. There is no pleural effusion or  pneumothorax. There are no pathologically enlarged axillary, hilar or mediastinal lymph nodes. The heart size is normal. There is coronary artery atherosclerosis in the LAD, circumflex and RCA. There is no pericardial effusion. The thoracic aorta is normal in caliber. Review of bone windows demonstrates no focal lytic or sclerotic lesions. Limited non-contrast images of the upper abdomen were obtained. The adrenal glands appear normal. The remainder of the visualized abdominal organs are unremarkable. IMPRESSION: 1. Bilateral patchy ground-glass opacities throughout the lungs. Differential diagnosis includes an infectious or inflammatory etiology such as idiopathic interstitial pneumonitis versus hypersensitivity pneumonitis versus alveolar edema. Electronically Signed   By: Elige KoHetal  Patel   On: 08/18/2015 12:44   Dg Chest Port 1 View  08/20/2015  CLINICAL DATA:  Cough atypical pneumonia EXAM: PORTABLE CHEST 1 VIEW COMPARISON:  08/16/2015 FINDINGS: Heart size normal  and stable. Mild interstitial change at both lung bases again identified. When compared to the prior study there is slightly improved bibasilar aeration. No consolidation or effusion. IMPRESSION: Persistent but improved bibasilar opacities Electronically Signed   By: Esperanza Heir M.D.   On: 08/20/2015 08:06     Admission HPI: Susan Bryan is a 44 year old female with PMH of HTN, Bipolar schizophrenia, Polysubstance abuse (tobacco, heroin, opioids, cocaine), HCV (RNA 553,152 on 10/2014; untreated), and prior pneumonia who presents with 1 week history of non-productive cough, subjective fevers and chills, dyspnea, and diaphoresis. She is accompanied by her partner. She reports being at her normal state of health when these symptoms began. She says she has had difficulty walking short distances due to dyspnea and has complaints of pressure like pain across her chest and sides that is occurring due to her cough. She says the cough is mostly  non-productive with minimal white phlegm brought up. It is on and off, but she does not associate it with positional change or meals. She also reports rhinorrhea with yellow coloration and headache during this time. She reports similar episodes in the past when she was diagnosed with pneumonia 2 and 3 years ago. Of note, she came to the ED on 10/10 for these symptoms and was thought to have an acute asthma exacerbation, satted well on room air while ambulating and showed some improvement with DuoNeb treatment. She was given Solu-Medrol once and prescription for albuterol inhaler, which she has used without relief. She denies any sick contacts or recent travel.   Patient's vitals on arrival to ED were BP 154/90, pulse 82, RR 18, SpO2 94% on room air. CXR showed interval development of heterogenous opacities in bilateral lung bases compared to CXR from 10/10, thought to represent multi-focal atypical infection.  Patient with 20 pack years smoking history and reports that she is currently attending [facility] since July for heroin and opioid abuse. She and her partner both live with patient's daughter in [place]. Patient is not working, on disability with bipolar schizophrenia.  Hospital Course by problem list: Principal Problem:   Community acquired pneumonia Active Problems:   Hypertension   Polysubstance abuse   Bipolar 1 disorder (HCC)   Hepatitis C   Personal history of asthma   1. Community-acquired pneumonia - Patient presents with 1 week of dry cough, subjective fevers, and shortness of breath initially thought to be an asthma exacerbation with negative CXR on 10/10, with worsening course with no improvement using albuterol over the week. CXR on admission 10/15 showed heterogeneous opacities bilaterally most consistent with multifocal infection superimposed on emphesematous changes. She was put on ceftriaxone and azithromycin as well as steroids, later to doxycycline due to  prolonged QTc. CT showed bilateral ground-glass opacities, pulmonology was consulted and rec'd continued antibiotics and steroids with rheum panel to e/f interstitial lung disease. She never required supplemental oxygen and her course gradually improved over the duration of her hospitalization until discharge was deemed appropriate.  Discharge Vitals:   BP 154/96 mmHg  Pulse 64  Temp(Src) 98.3 F (36.8 C) (Oral)  Resp 13  Ht  (1.753 m)  Wt 185 lb (83.915 kg)  BMI 27.31 kg/m2  SpO2 96%  Discharge Labs:  Results for orders placed or performed during the hospital encounter of 08/16/15 (from the past 24 hour(s))  Sedimentation rate     Status: None   Collection Time: 08/19/15  4:48 PM  Result Value Ref Range  Sed Rate 16 0 - 22 mm/hr  C-reactive protein     Status: Abnormal   Collection Time: 08/19/15  4:48 PM  Result Value Ref Range   CRP 1.5 (H) <1.0 mg/dL  Rheumatoid factor     Status: None   Collection Time: 08/19/15  4:48 PM  Result Value Ref Range   Rhuematoid fact SerPl-aCnc <10.0 0.0 - 13.9 IU/mL  Procalcitonin - Baseline     Status: None   Collection Time: 08/19/15  4:48 PM  Result Value Ref Range   Procalcitonin <0.10 ng/mL  CBC with Differential/Platelet     Status: Abnormal   Collection Time: 08/19/15  4:48 PM  Result Value Ref Range   WBC 9.8 4.0 - 10.5 K/uL   RBC 4.29 3.87 - 5.11 MIL/uL   Hemoglobin 14.2 12.0 - 15.0 g/dL   HCT 40.9 81.1 - 91.4 %   MCV 101.4 (H) 78.0 - 100.0 fL   MCH 33.1 26.0 - 34.0 pg   MCHC 32.6 30.0 - 36.0 g/dL   RDW 78.2 95.6 - 21.3 %   Platelets 302 150 - 400 K/uL   Neutrophils Relative % 62 %   Neutro Abs 6.1 1.7 - 7.7 K/uL   Lymphocytes Relative 29 %   Lymphs Abs 2.8 0.7 - 4.0 K/uL   Monocytes Relative 7 %   Monocytes Absolute 0.7 0.1 - 1.0 K/uL   Eosinophils Relative 2 %   Eosinophils Absolute 0.2 0.0 - 0.7 K/uL   Basophils Relative 0 %   Basophils Absolute 0.0 0.0 - 0.1 K/uL    Signed: Darrick Huntsman, MD 08/20/2015,  11:17 AM

## 2015-08-20 NOTE — Progress Notes (Signed)
Name: Susan Bryan [facility] MRN: [id number] DOB: February 15, 1971    ADMISSION DATE:  08/16/2015 CONSULTATION DATE:  08/19/2015  REFERRING MD :  Evette Doffing - IMTS  CHIEF COMPLAINT:  SOB  BRIEF PATIENT DESCRIPTION: 44 year old female who presented 08/16/2015 complaining of shortness of breath and nonproductive cough. Initial chest x-ray concerning for atypical pneumonia. CT chest was obtained showed bilateral patchy GGO. PCCM to see  SIGNIFICANT EVENTS  10/10 > to ED with URI symptoms. CXR negative. Given solumedrol and discharged with albuterol without relief.  10/15 > Admit for presumed atypical PNA based on CXR 10/18 > PCCM consult  STUDIES:  10/17 CT chest > Bilateral patchy ground-glass opacities throughout the lungs. Differential diagnosis includes an infectious or inflammatory etiology such as idiopathic interstitial pneumonitis versus hypersensitivity pneumonitis versus alveolar edema   HISTORY OF PRESENT ILLNESS:  44 year old female with past medical history as below, which includes hypertension, hepatitis C, bipolar disorder, polysubstance abuse including IV drugs and cocaine. She is on methadone due to heroin and opioid abuse.  She initially presented to [facility] emergency department 08/11/2015 complaining of URI symptoms. Chest x-ray at that time was negative. She was presumed to be having an asthma exacerbation and  was given a shot of Solu-Medrol and discharged with albuterol inhaler which has provided her with no relief. 08/16/2015 she again presented to the emergency department complaining of one-week history of nonproductive cough, chills, dyspnea on exertion, and diaphoresis. In the ED her vitals are stable although O2 sat was 94% on room air. Chest x-ray showed development of bilateral opacifications consistent with atypical pneumonia. She was admitted and treated with IV ceftriaxone and azithromycin which is since been changed to doxycycline in the setting of prolonged QTC (this has  since resolved). She was also initiated on prednisone therapy. 08/18/2015 CT chest was obtained and demonstrated bilateral patchy groundglass opacifications. PCCM has been asked to see. 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 Has not worked at either of these places for 10 years. She has no pets at home. She has a 30 pack year smoking history. She has recently used crack/cocaine by way of smoking, tired this short term and did not like it. She also complains of chest burning which has been constant with no aggravating/alleviated factors. Does have GERD and takes omeprazole and feels that this is pretty well controlled, however, she will occasionally awake from sleep with throat burning. Also complains of sinus congestion. No family history of autoimmune disease. She has lived in trailer with questionable definite mold/allergen exposures.    PAST MEDICAL HISTORY :   has a past medical history of Hypertension; Bipolar 1 disorder ([place]); Hepatitis C; GERD (gastroesophageal reflux disease); Polysubstance abuse; Family history of adverse reaction to anesthesia; and Pneumonia (08/2015).  has past surgical history that includes Vascular surgery; Appendectomy; and Abdominal hysterectomy. Prior to Admission medications   Medication Sig Start Date End Date Taking? Authorizing Provider  albuterol (PROVENTIL) (2.5 MG/3ML) 0.083% nebulizer solution Take 3 mLs (2.5 mg total) by nebulization every 6 (six) hours as needed for wheezing or shortness of breath. 08/11/15  Yes Jeffrey Hedges, PA-C  Aspirin-Acetaminophen (GOODYS BODY PAIN PO) Take 1 tablet by mouth 3 (three) times daily as needed (back pain).   Yes Historical Provider, MD  gabapentin (NEURONTIN) 300 MG capsule Take 300 mg by mouth 2 (two) times daily. 04/14/15  Yes Historical Provider, MD  lisinopril (PRINIVIL,ZESTRIL) 10 MG tablet Take 10 mg by mouth  daily.   Yes Historical Provider, MD  methadone (DOLOPHINE)  5 MG tablet Take 120 mg by mouth daily.   Yes Historical Provider, MD  omeprazole (PRILOSEC) 20 MG capsule Take 20 mg by mouth daily.   Yes Historical Provider, MD  traZODone (DESYREL) 100 MG tablet Take 100 mg by mouth at bedtime. 02/26/15  Yes Historical Provider, MD  hydrOXYzine (ATARAX/VISTARIL) 25 MG tablet Take 1 tablet (25 mg total) by mouth every 6 (six) hours. Patient not taking: Reported on 08/11/2015 06/16/15   Arthor CaptainAbigail Harris, PA-C  ibuprofen (ADVIL,MOTRIN) 800 MG tablet Take 1 tablet (800 mg total) by mouth every 8 (eight) hours as needed for mild pain or moderate pain. Patient not taking: Reported on 05/15/2015 12/25/14   Trixie DredgeEmily West, PA-C  naproxen (NAPROSYN) 500 MG tablet Take 1 tablet (500 mg total) by mouth 2 (two) times daily. Patient not taking: Reported on 05/15/2015 11/04/14   Joycie PeekBenjamin Cartner, PA-C  naproxen (NAPROSYN) 500 MG tablet Take 1 tablet (500 mg total) by mouth 2 (two) times daily as needed for mild pain, moderate pain or headache (TAKE WITH MEALS.). Patient not taking: Reported on 08/11/2015 05/15/15   Mercedes Camprubi-Soms, PA-C  oxyCODONE-acetaminophen (PERCOCET/ROXICET) 5-325 MG per tablet Take 1 tablet by mouth every 6 (six) hours as needed for severe pain. Patient not taking: Reported on 05/15/2015 03/16/15   Marisa Severinlga Otter, MD  predniSONE (DELTASONE) 20 MG tablet 3 tabs po daily x 3 days, then 2 tabs x 3 days, then 1.5 tabs x 3 days, then 1 tab x 3 days, then 0.5 tabs x 3 days Patient not taking: Reported on 08/11/2015 06/16/15   Arthor CaptainAbigail Harris, PA-C  traMADol (ULTRAM) 50 MG tablet Take 1 tablet (50 mg total) by mouth every 6 (six) hours as needed. Patient not taking: Reported on 08/11/2015 05/23/15   Garlon HatchetLisa M Sanders, PA-C   No Known Allergies  FAMILY HISTORY:  family history includes Heart disease in her father and mother. SOCIAL HISTORY:  reports that she has been smoking Cigarettes.  She has a 20 pack-year smoking history. She has never used smokeless tobacco. She  reports that she drinks alcohol. She reports that she does not use illicit drugs.  REVIEW OF SYSTEMS:   Bolds are positive  Constitutional: weight loss, gain, night sweats, Fevers, chills, fatigue .  HEENT: headaches, Sore throat, sneezing, nasal congestion, post nasal drip, Difficulty swallowing, Tooth/dental problems, visual complaints visual changes, ear ache CV:  chest pain, radiates: ,Orthopnea, PND, swelling in lower extremities, dizziness, palpitations, syncope.  GI  heartburn, indigestion, abdominal pain, nausea, vomiting, diarrhea, change in bowel habits, loss of appetite, bloody stools.  Resp: cough, non-productive: , hemoptysis, dyspnea worse on exertion, chest pain, pleuritic.  Skin: rash or itching or icterus GU: dysuria, change in color of urine, urgency or frequency. flank pain, hematuria  MS: joint pain or swelling. decreased range of motion  Psych: change in mood or affect. depression or anxiety.  Neuro: difficulty with speech, weakness, numbness, ataxia    SUBJECTIVE:   VITAL SIGNS: Temp:  [97.7 F (36.5 C)-98.4 F (36.9 C)] 98.3 F (36.8 C) (10/19 0628) Pulse Rate:  [64-74] 64 (10/19 0628) Resp:  [13-20] 13 (10/19 0628) BP: (100-154)/(68-96) 154/96 mmHg (10/19 0628) SpO2:  [93 %-100 %] 96 % (10/19 0628)  PHYSICAL EXAMINATION: General:  Overweight female in NAD Neuro:  Alert,oriented, non-focal HEENT:  [place]/AT, no appreciable JVD Cardiovascular:  RRR, no MRG Lungs:  Bilateral rales Abdomen:  Soft, non-tender, non-distended Musculoskeletal:  No acute  deformity or ROM limitation Skin:  Grossly intact   Recent Labs Lab 08/16/15 1100  NA 141  K 3.1*  CL 105  CO2 28  BUN <5*  CREATININE 0.63  GLUCOSE 93    Recent Labs Lab 08/16/15 1100 08/19/15 1648  HGB 14.9 14.2  HCT 44.1 43.5  WBC 16.8* 9.8  PLT 274 302   Ct Chest Wo Contrast  08/18/2015  CLINICAL DATA:  Rule out pneumonia, chest pain EXAM: CT CHEST WITHOUT CONTRAST TECHNIQUE: Multidetector  CT imaging of the chest was performed following the standard protocol without IV contrast. COMPARISON:  Chest x-ray 08/16/2015 FINDINGS: The central airways are patent. There are patchy bilateral ground-glass opacities in the bilateral upper lobes, bilateral lower lobes and right middle lobe. There is no pleural effusion or pneumothorax. There are no pathologically enlarged axillary, hilar or mediastinal lymph nodes. The heart size is normal. There is coronary artery atherosclerosis in the LAD, circumflex and RCA. There is no pericardial effusion. The thoracic aorta is normal in caliber. Review of bone windows demonstrates no focal lytic or sclerotic lesions. Limited non-contrast images of the upper abdomen were obtained. The adrenal glands appear normal. The remainder of the visualized abdominal organs are unremarkable. IMPRESSION: 1. Bilateral patchy ground-glass opacities throughout the lungs. Differential diagnosis includes an infectious or inflammatory etiology such as idiopathic interstitial pneumonitis versus hypersensitivity pneumonitis versus alveolar edema. Electronically Signed   By: Elige Ko   On: 08/18/2015 12:44   Dg Chest Port 1 View  08/20/2015  CLINICAL DATA:  Cough atypical pneumonia EXAM: PORTABLE CHEST 1 VIEW COMPARISON:  08/16/2015 FINDINGS: Heart size normal and stable. Mild interstitial change at both lung bases again identified. When compared to the prior study there is slightly improved bibasilar aeration. No consolidation or effusion. IMPRESSION: Persistent but improved bibasilar opacities Electronically Signed   By: Esperanza Heir M.D.   On: 08/20/2015 08:06    ASSESSMENT / PLAN:  Diffuse pulmonary infiltrates  Tobacco abuse disorder  Discussion: Likely atypical PNA in setting of leukocytosis/fevers, also concern for autoimmune etiology such as RA or lupus but she has low sed rate and CRP. GERD with aspiration could also play role. Crack lung possible but patient states  that she has not used cocaine in over a month.  She is feeling better today. Able to do PT without issue. CXR is slightly improved.   - Supplemental O2 as needed to keep SpO2 > 92% - Continue CAP/atypical PNA coverage with ceftriaxone and doxycycline (azithro d/c'd due to prolonged QTc) - Continue prednisone - Follow autoimmune workup (ACE, RA factor, lupus panel, ana, anca)  - Will need outpatient pulmonary follow up - Smoking cessation counseled  Chilton Greathouse MD [facility] Pager [phone] If no answer or after 3pm call: [phone] 08/20/2015, 9:12 AM

## 2015-08-20 NOTE — Care Management Note (Signed)
Case Management Note  Patient Details  Name: Susan Bryan MRN: [id number] Date of Birth: 1971/06/12  Subjective/Objective:     Patient is for dc today to home, no other needs identified.                Action/Plan:   Expected Discharge Date:                  Expected Discharge Plan:  Home/Self Care  In-House Referral:     Discharge planning Services  CM Consult  Post Acute Care Choice:    Choice offered to:     DME Arranged:    DME Agency:     HH Arranged:    HH Agency:     Status of Service:  Completed, signed off  Medicare Important Message Given:    Date Medicare IM Given:    Medicare IM give by:    Date Additional Medicare IM Given:    Additional Medicare Important Message give by:     If discussed at Long Length of Stay Meetings, dates discussed:    Additional Comments:  Leone Havenaylor, Maksymilian Mabey Clinton, RN 08/20/2015, 12:10 PM

## 2015-08-20 NOTE — Progress Notes (Signed)
Subjective: [name] is a 44yo F with PMH HTN, bipolar disorder, schizophrenia, untreated HCV, polysubstance abuse (tobacco, heroin/IVDU, opioids, cocaine), and 2 prior episodes of pneumonia here for 1 week of non-productive cough, subjective fevers and chills, and shortness of breath. Her cough and dyspnea have been improving as compared to previous days. She has been able to ambulate well without desaturating throughout the past day. She has no other complaints at this time.  Objective: Vital signs in last 24 hours: Filed Vitals:   08/19/15 1641 08/19/15 2225 08/19/15 2347 08/20/15 0628  BP: 100/68 140/78  154/96  Pulse: 66 74 69 64  Temp: 98.4 F (36.9 C) 97.7 F (36.5 C)  98.3 F (36.8 C)  TempSrc: Oral Oral    Resp: 20 16 18 13   Height:      Weight:      SpO2: 98% 98% 98% 96%   Weight change:   Intake/Output Summary (Last 24 hours) at 08/20/15 0844 Last data filed at 08/19/15 1830  Gross per 24 hour  Intake   1074 ml  Output      0 ml  Net   1074 ml   General appearance: alert and no distress Lungs: bilateral rales in expiration auscultated. No abnormal breath sounds on inspiration heard. No wheezes or rhonchi heard. Normal respiratory effort.  Heart: regular rate and rhythm, S1, S2 normal, no murmur, click, rub or gallop Abdomen: soft, non-tender; bowel sounds normal; no masses,  no organomegaly Extremities: extremities normal, atraumatic, no cyanosis or edema Pulses: 2+ and symmetric Skin: Skin color, texture, turgor normal. No rashes or lesions Neurologic: Alert and oriented X 3, normal strength and tone. Normal symmetric reflexes. Normal coordination and gait   Lab Results: No new labs  Assessment/Plan: Principal Problem:   Community acquired pneumonia Active Problems:   Hypertension   Polysubstance abuse   Bipolar 1 disorder (HCC)   Hepatitis C   Personal history of asthma  Pneumonia - Patient presents with 1 week of dry cough, subjective fevers, and  shortness of breath initially thought to be an asthma exacerbation with negative CXR on 10/10, with worsening course with no improvement using albuterol over the past week. CXR on 10/15 shows heterogeneous opacities bilaterally most consistent with multifocal infection superimposed on emphesematous changes. She does have a history of similar symptoms 2 and 3 years ago, respectively. Currently, still has nonproductive cough, but otherwise afebrile, hemodynamically stable and satting in the mid-90's on room air in no apparent respiratory distress. Strep pneumoniae urine antigen negative. CT chest on 10/17 showed bilateral ground glass opacities, which is nonspecific and may simply represent atypical pneumonia but does seem severe for a simple pneumonia and may represent other etiologies such as various interstitial lung diseases, rheumatologic disorders, or hypersensitivity pneumonitis. 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  -Continue IV ceftriaxone and PO doxycycline (switched from azithromycin 2/2 long QTc of 637 on initial EKG), now day 5. Will switch to oral augmentin and doxy upon discharge for 8 days total. -CXR shows interval improvement of infiltrates. WBC normalized from initial on admission -Mucinex DM 30-600mg  PO BID -Tessalon PRN for continued cough -Duonebs q6h -Prednisone 40mg  today, continue to taper -Incentive spirometry -Pertussis antigen -Naproxen PRN for breakthrough post-tussive pain and headache -F/u BCx and sputum cultures -Pulmonology consulted - rec'd continuing abx and  steroids, follow-up rheum panel, and follow-up in clinic in 1 month with full PFTs  Polysubstance abuse - Patient currently on methadone  QD (dose ~9am daily) for heroin/opioid abuse treatment, follows with [facility]. -Methadone per pharmacy -Repeat EKG for initial  long QTc showed QTc in 420s  HTN -Lisinopril  QD  GERD -Continue home pantoprazole  QD  PPX -DVT: Lovenox  Dispo: Disposition is deferred at this time, awaiting improvement of current medical problems.  Anticipated discharge in approximately 1 day(s).   Darrick Huntsman, MD 08/20/2015, 8:44 AM

## 2015-08-20 NOTE — Discharge Instructions (Signed)
Please follow-up with your primary care doctor at your earliest convenience to check on how your breathing and cough are improving. Continue to take the antibiotics and steroids until completed, even if you are feeling better before then. Quitting smoking is also extremely important. Using the quitline to get patches may be very useful here. If you are unable to fill your antibiotics today or are unable get an appointment with the lung doctors in the next few weeks, please let us know and we will help you resolve this issues.

## 2015-08-20 NOTE — Progress Notes (Signed)
NURSING PROGRESS NOTE  Susan Bryan [facility] [id number] Discharge Data: 08/20/2015 12:39 PM Attending Provider: Tyson Alias, MD PCP:Pcp Not In System     [facility] to be D/C'd Home per MD order.  Discussed with the patient the After Visit Summary and all questions fully answered. All IV's discontinued with no bleeding noted. All belongings returned to patient for patient to take home.   Last Vital Signs:  Blood pressure 154/96, pulse 64, temperature 98.3 F (36.8 C), temperature source Oral, resp. rate 13, height  (1.753 m), weight 83.915 kg (185 lb), SpO2 96 %.  Discharge Medication List   Medication List    TAKE these medications        albuterol (2.5 MG/3ML) 0.083% nebulizer solution  Commonly known as:  PROVENTIL  Take 3 mLs (2.5 mg total) by nebulization every 6 (six) hours as needed for wheezing or shortness of breath.     amoxicillin-clavulanate 875-125 MG tablet  Commonly known as:  AUGMENTIN  Take 1 tablet by mouth 2 (two) times daily.     benzonatate 100 MG capsule  Commonly known as:  TESSALON  Take 1 capsule (100 mg total) by mouth 2 (two) times daily as needed for cough.     doxycycline 100 MG tablet  Commonly known as:  VIBRA-TABS  Take 1 tablet (100 mg total) by mouth every 12 (twelve) hours.     gabapentin 300 MG capsule  Commonly known as:  NEURONTIN  Take 300 mg by mouth 2 (two) times daily.     GOODYS BODY PAIN PO  Take 1 tablet by mouth 3 (three) times daily as needed (back pain).     guaiFENesin-dextromethorphan 100-10 MG/5ML syrup  Commonly known as:  ROBITUSSIN DM  Take 10 mLs by mouth every 4 (four) hours as needed for cough.     hydrOXYzine 25 MG tablet  Commonly known as:  ATARAX/VISTARIL  Take 1 tablet (25 mg total) by mouth every 6 (six) hours.     ibuprofen 800 MG tablet  Commonly known as:  ADVIL,MOTRIN  Take 1 tablet (800 mg total) by mouth every 8 (eight) hours as needed for mild pain or moderate pain.     lisinopril 10 MG tablet  Commonly known as:  PRINIVIL,ZESTRIL  Take 10 mg by mouth daily.     methadone 5 MG tablet  Commonly known as:  DOLOPHINE  Take 120 mg by mouth daily.     naproxen 500 MG tablet  Commonly known as:  NAPROSYN  Take 1 tablet (500 mg total) by mouth 2 (two) times daily.     naproxen 500 MG tablet  Commonly known as:  NAPROSYN  Take 1 tablet (500 mg total) by mouth 2 (two) times daily as needed for mild pain, moderate pain or headache (TAKE WITH MEALS.).     omeprazole 20 MG capsule  Commonly known as:  PRILOSEC  Take 20 mg by mouth daily.     oxyCODONE-acetaminophen 5-325 MG tablet  Commonly known as:  PERCOCET/ROXICET  Take 1 tablet by mouth every 6 (six) hours as needed for severe pain.     predniSONE 20 MG tablet  Commonly known as:  DELTASONE  Take 2 tablets (40 mg total) by mouth daily with breakfast.     traMADol 50 MG tablet  Commonly known as:  ULTRAM  Take 1 tablet (50 mg total) by mouth every 6 (six) hours as needed.     traZODone 100 MG tablet  Commonly known  as:  DESYREL  Take 100 mg by mouth at bedtime.         Roma KayserStephanie Albirtha Grinage, RN

## 2015-08-21 LAB — CULTURE, BLOOD (ROUTINE X 2)
CULTURE: NO GROWTH
Culture: NO GROWTH

## 2015-08-21 LAB — JO-1 ANTIBODY-IGG: JO-1 ANTIBODY, IGG: NEGATIVE

## 2016-02-27 IMAGING — DX DG CHEST 2V
2 series · 2 of 2 positions shown · non-contrast
Comparison: 06/12/2014 chest rate

CLINICAL DATA: Cough

EXAM:
CHEST  2 VIEW

[chest pa]
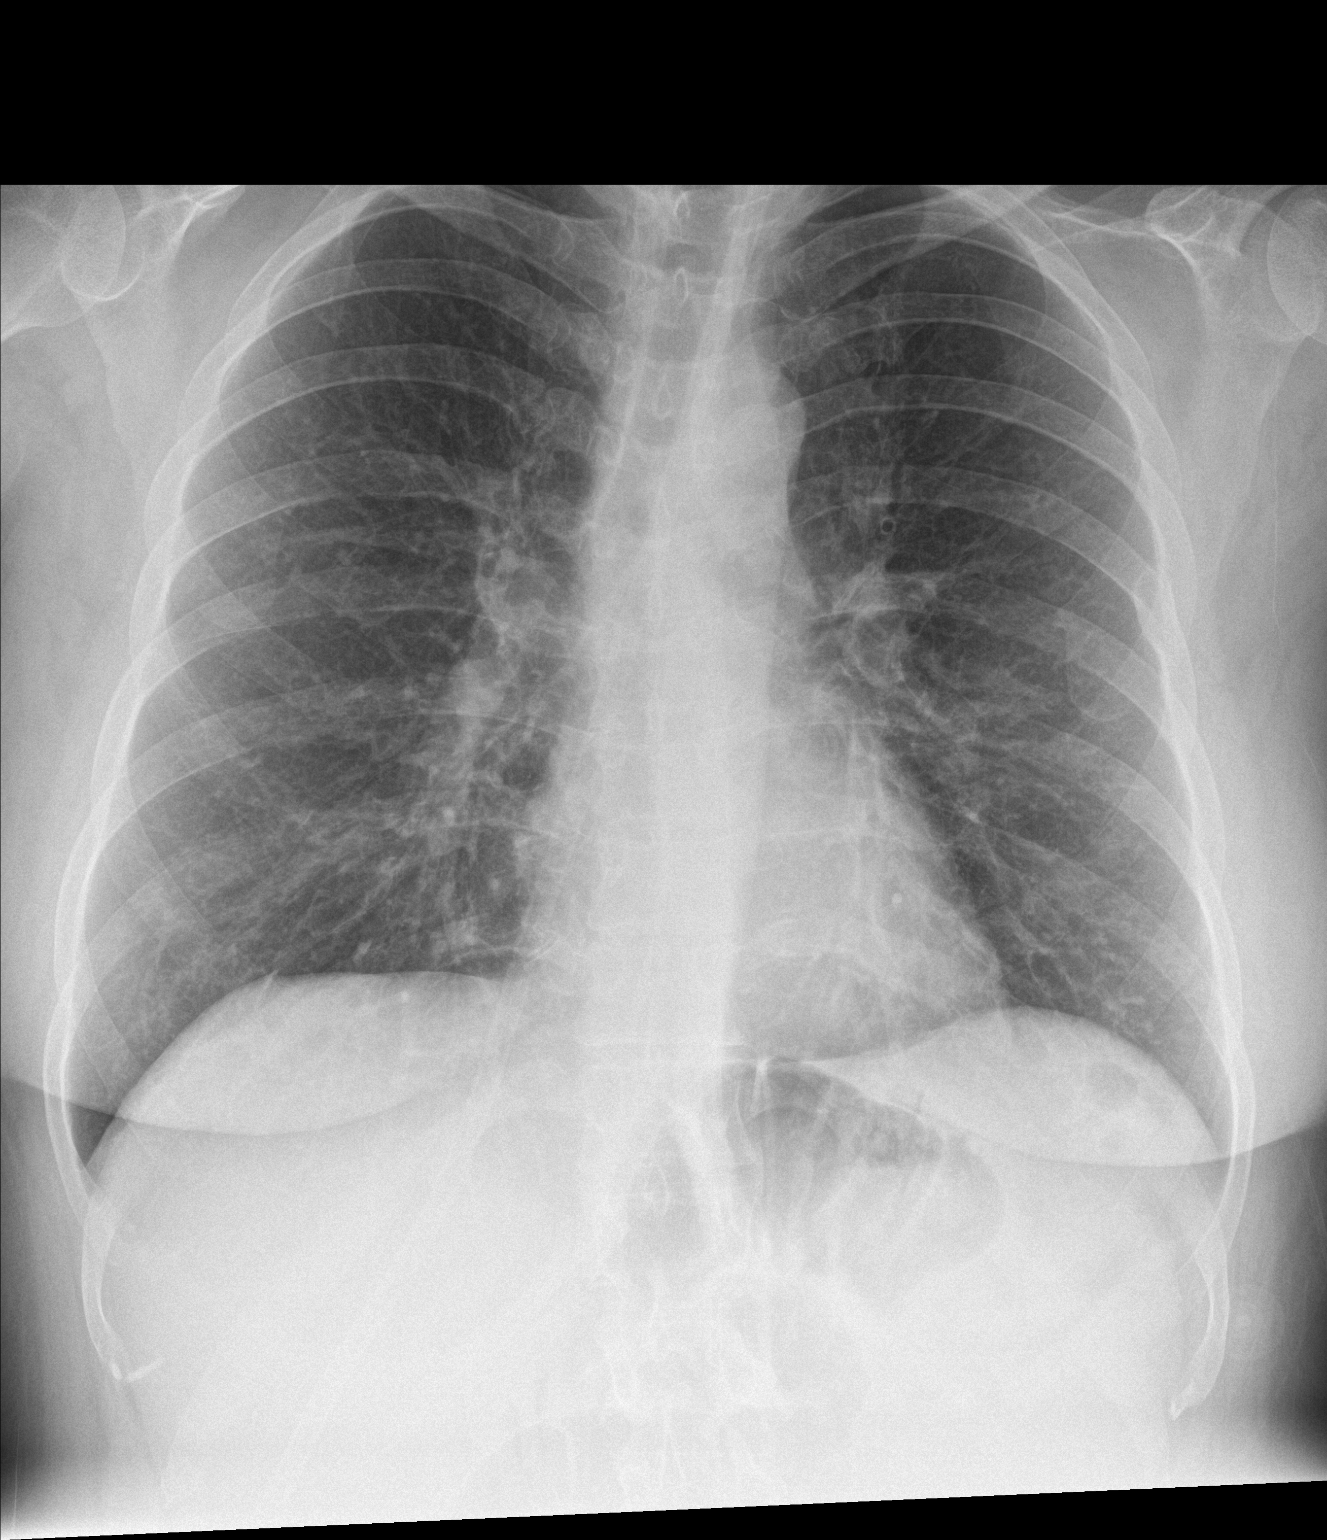

[chest lat]
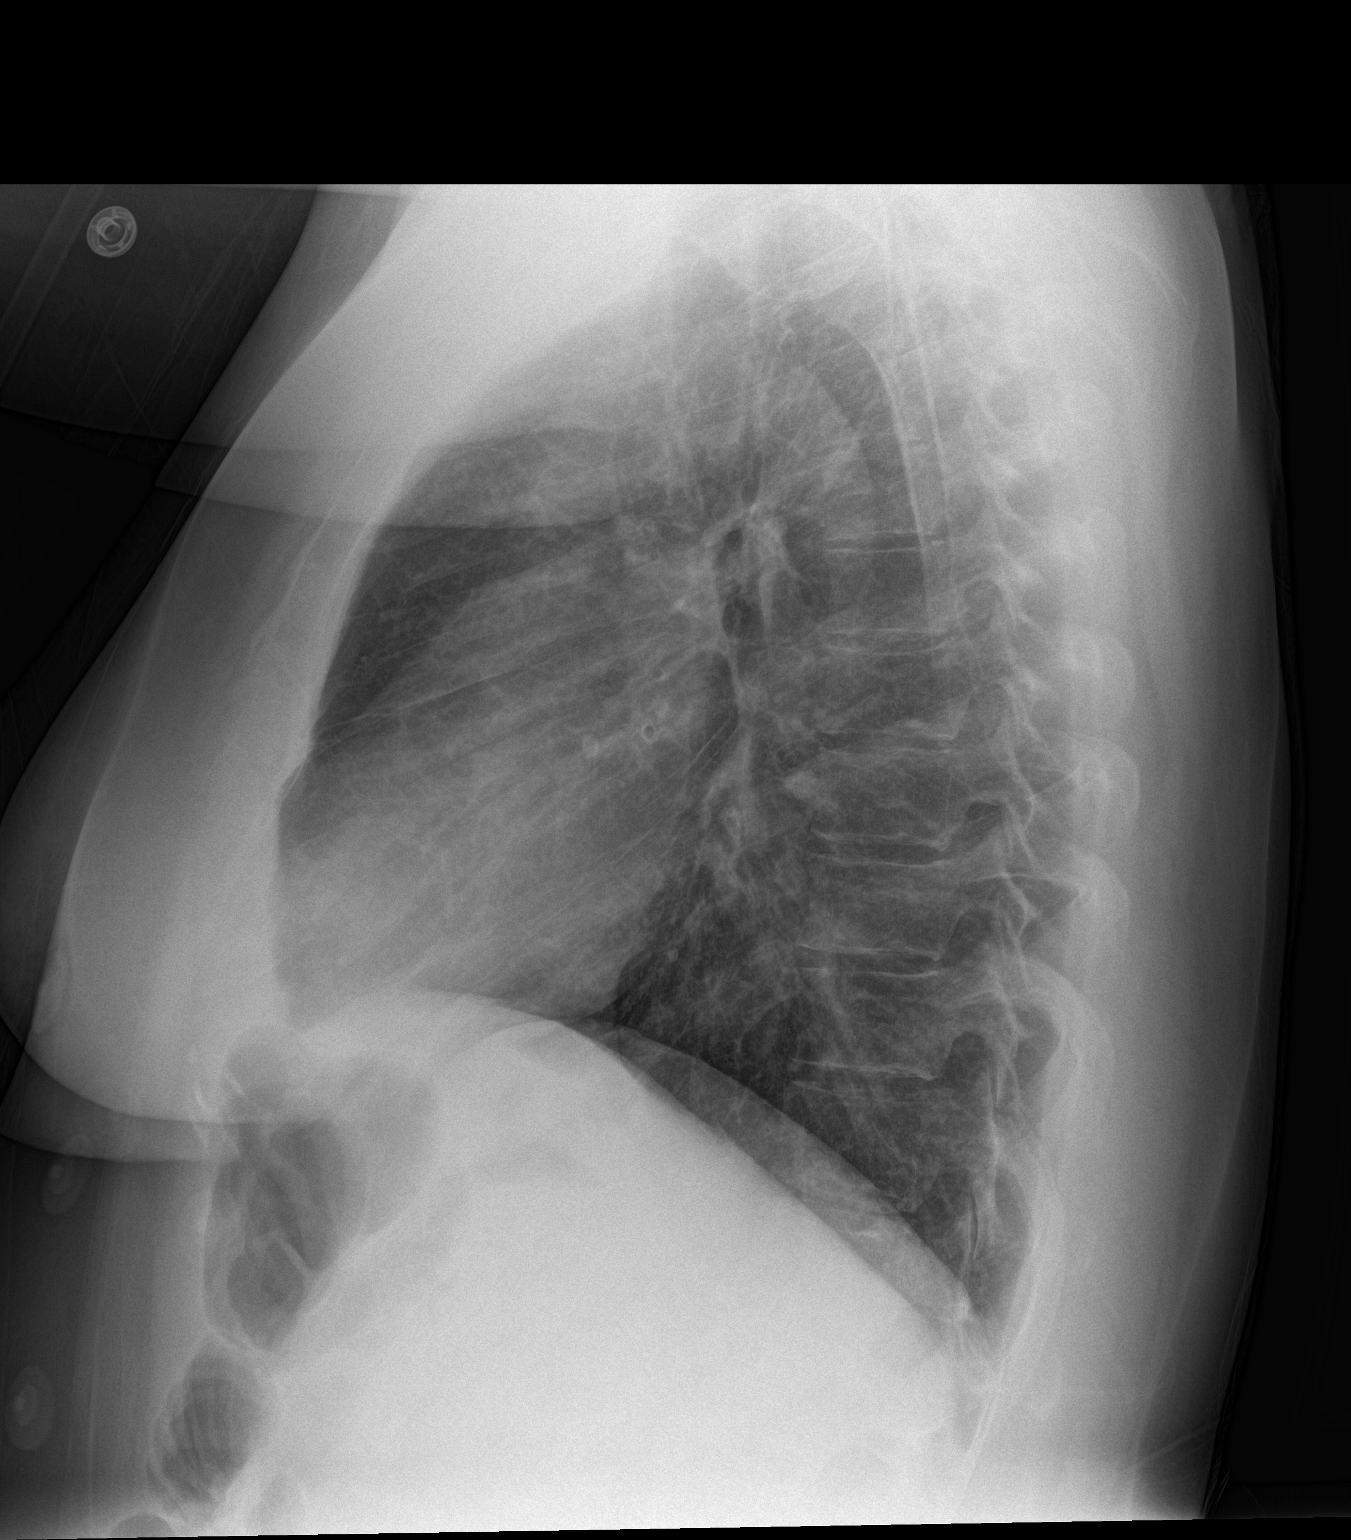

[2 of 2 positions shown; findings below may reference images not displayed]

FINDINGS: Stable cardiomediastinal silhouette with normal heart size. No
pneumothorax. No pleural effusion. Clear lungs, with no focal lung
consolidation and no pulmonary edema.
IMPRESSION: No active cardiopulmonary disease.

## 2016-04-25 ENCOUNTER — Emergency Department (HOSPITAL_COMMUNITY)
Admission: EM | Admit: 2016-04-25 | Discharge: 2016-04-25 | Disposition: A | Discharge status: Home / Self Care | Payer: Medicare HMO | Attending: Emergency Medicine | Admitting: Emergency Medicine

## 2016-04-25 ENCOUNTER — Emergency Department (HOSPITAL_COMMUNITY): Payer: Medicare HMO

## 2016-04-25 DIAGNOSIS — R0602 Shortness of breath: Secondary | ICD-10-CM | POA: Diagnosis present

## 2016-04-25 DIAGNOSIS — F319 Bipolar disorder, unspecified: Secondary | ICD-10-CM | POA: Insufficient documentation

## 2016-04-25 DIAGNOSIS — Z7982 Long term (current) use of aspirin: Secondary | ICD-10-CM | POA: Diagnosis not present

## 2016-04-25 DIAGNOSIS — Z79899 Other long term (current) drug therapy: Secondary | ICD-10-CM | POA: Insufficient documentation

## 2016-04-25 DIAGNOSIS — F17210 Nicotine dependence, cigarettes, uncomplicated: Secondary | ICD-10-CM | POA: Insufficient documentation

## 2016-04-25 DIAGNOSIS — J418 Mixed simple and mucopurulent chronic bronchitis: Secondary | ICD-10-CM | POA: Diagnosis not present

## 2016-04-25 DIAGNOSIS — I10 Essential (primary) hypertension: Secondary | ICD-10-CM | POA: Diagnosis not present

## 2016-04-25 LAB — CBC WITH DIFFERENTIAL/PLATELET
BASOS ABS: 0 10*3/uL (ref 0.0–0.1)
BASOS PCT: 0 %
EOS ABS: 0.2 10*3/uL (ref 0.0–0.7)
EOS PCT: 2 %
HCT: 41.9 % (ref 36.0–46.0)
HEMOGLOBIN: 13.4 g/dL (ref 12.0–15.0)
Lymphocytes Relative: 32 %
Lymphs Abs: 3 10*3/uL (ref 0.7–4.0)
MCH: 31.8 pg (ref 26.0–34.0)
MCHC: 32 g/dL (ref 30.0–36.0)
MCV: 99.3 fL (ref 78.0–100.0)
Monocytes Absolute: 0.7 10*3/uL (ref 0.1–1.0)
Monocytes Relative: 7 %
NEUTROS PCT: 59 %
Neutro Abs: 5.4 10*3/uL (ref 1.7–7.7)
PLATELETS: 230 10*3/uL (ref 150–400)
RBC: 4.22 MIL/uL (ref 3.87–5.11)
RDW: 13.5 % (ref 11.5–15.5)
WBC: 9.3 10*3/uL (ref 4.0–10.5)

## 2016-04-25 LAB — BASIC METABOLIC PANEL
ANION GAP: 5 (ref 5–15)
BUN: 11 mg/dL (ref 6–20)
CALCIUM: 8.4 mg/dL — AB (ref 8.9–10.3)
CO2: 24 mmol/L (ref 22–32)
CREATININE: 0.69 mg/dL (ref 0.44–1.00)
Chloride: 108 mmol/L (ref 101–111)
GFR calc non Af Amer: >60 mL/min (ref >60)
GLUCOSE: 83 mg/dL (ref 65–99)
POTASSIUM: 5 mmol/L (ref 3.5–5.1)
SODIUM: 137 mmol/L (ref 135–145)

## 2016-04-25 MED ORDER — OXYCODONE-ACETAMINOPHEN 5-325 MG PO TABS
1.0000 | ORAL_TABLET | Freq: Once | ORAL | Status: AC
  Administered 2016-04-25: 1 via ORAL
  Filled 2016-04-25: qty 1

## 2016-04-25 MED ORDER — LISINOPRIL 10 MG PO TABS
10.0000 mg | ORAL_TABLET | Freq: Once | ORAL | Status: AC
  Administered 2016-04-25: 10 mg via ORAL
  Filled 2016-04-25: qty 1

## 2016-04-25 MED ORDER — DOXYCYCLINE HYCLATE 100 MG PO CAPS: 100.0000 mg | ORAL_CAPSULE | Freq: Two times a day (BID) | ORAL | Status: DC

## 2016-04-25 MED ORDER — METHYLPREDNISOLONE SODIUM SUCC 125 MG IJ SOLR
125.0000 mg | Freq: Once | INTRAMUSCULAR | Status: AC
  Administered 2016-04-25: 125 mg via INTRAVENOUS
  Filled 2016-04-25: qty 2

## 2016-04-25 MED ORDER — PREDNISONE 10 MG PO TABS: 40.0000 mg | ORAL_TABLET | Freq: Every day | ORAL | Status: DC

## 2016-04-25 MED ORDER — IPRATROPIUM BROMIDE 0.02 % IN SOLN
0.5000 mg | Freq: Once | RESPIRATORY_TRACT | Status: AC
  Administered 2016-04-25: 0.5 mg via RESPIRATORY_TRACT
  Filled 2016-04-25: qty 2.5

## 2016-04-25 MED ORDER — ALBUTEROL SULFATE (2.5 MG/3ML) 0.083% IN NEBU
5.0000 mg | INHALATION_SOLUTION | Freq: Once | RESPIRATORY_TRACT | Status: AC
  Administered 2016-04-25: 5 mg via RESPIRATORY_TRACT
  Filled 2016-04-25: qty 6

## 2016-04-25 MED ORDER — HYDROCODONE-HOMATROPINE 5-1.5 MG/5ML PO SYRP: 5.0000 mL | ORAL_SOLUTION | Freq: Four times a day (QID) | ORAL | Status: DC | PRN

## 2016-04-25 MED ORDER — ALBUTEROL SULFATE HFA 108 (90 BASE) MCG/ACT IN AERS
2.0000 | INHALATION_SPRAY | RESPIRATORY_TRACT | Status: DC | PRN
  Administered 2016-04-25: 2 via RESPIRATORY_TRACT
  Filled 2016-04-25: qty 6.7

## 2016-04-25 MED ORDER — NICOTINE 14 MG/24HR TD PT24: 14.0000 mg | MEDICATED_PATCH | Freq: Every day | TRANSDERMAL | Status: DC

## 2016-04-25 NOTE — ED Provider Notes (Signed)
CSN: [id number]     Arrival date & time 04/25/16  0913 History   None    Chief Complaint  Patient presents with  . Headache  . Shortness of Breath      HPI Patient presents with productive cough and wheezing for last several days.  Has had headache for 2 days.  Patient just finished a round of antibiotics for pneumonia.  Was seen by primary care doctor.  Patient has chest discomfort when she coughs.  She is coughing up colored sputum.  She continues to smoke.  Patient also has hypertension and did not take her medication this morning. Past Medical History  Diagnosis Date  . Hypertension   . Bipolar 1 disorder (HCC)   . Hepatitis C   . GERD (gastroesophageal reflux disease)   . Polysubstance abuse   . Family history of adverse reaction to anesthesia     ' my Mom did "  . Pneumonia 08/2015   Past Surgical History  Procedure Laterality Date  . Vascular surgery    . Appendectomy    . Abdominal hysterectomy     Family History  Problem Relation Age of Onset  . Heart disease Mother   . Heart disease Father    Social History  Substance Use Topics  . Smoking status: Current Every Day Smoker -- 1.00 packs/day for 20 years    Types: Cigarettes  . Smokeless tobacco: Never Used  . Alcohol Use: Yes     Comment: socially   OB History    No data available     Review of Systems  All other systems reviewed and are negative  Allergies  Review of patient's allergies indicates no known allergies.  Home Medications   Prior to Admission medications   Medication Sig Start Date End Date Taking? Authorizing Provider  albuterol (PROVENTIL) (2.5 MG/3ML) 0.083% nebulizer solution Take 3 mLs (2.5 mg total) by nebulization every 6 (six) hours as needed for wheezing or shortness of breath. 08/11/15  Yes Jeffrey Hedges, PA-C  Aspirin-Acetaminophen (GOODYS BODY PAIN PO) Take 1 tablet by mouth 3 (three) times daily as needed (back pain).   Yes Historical Provider, MD  escitalopram (LEXAPRO)  10 MG tablet Take 10 mg by mouth daily.   Yes Historical Provider, MD  gabapentin (NEURONTIN) 300 MG capsule Take 300 mg by mouth 3 (three) times daily.  04/14/15  Yes Historical Provider, MD  lamoTRIgine (LAMICTAL) 100 MG tablet Take 100 mg by mouth 2 (two) times daily.   Yes Historical Provider, MD  lisinopril (PRINIVIL,ZESTRIL) 10 MG tablet Take 10 mg by mouth daily.   Yes Historical Provider, MD  methadone (DOLOPHINE) 5 MG tablet Take 120 mg by mouth daily.   Yes Historical Provider, MD  omeprazole (PRILOSEC) 20 MG capsule Take 20 mg by mouth daily.   Yes Historical Provider, MD  traZODone (DESYREL) 100 MG tablet Take 100 mg by mouth at bedtime. 02/26/15  Yes Historical Provider, MD  doxycycline (VIBRAMYCIN) 100 MG capsule Take 1 capsule (100 mg total) by mouth 2 (two) times daily. 04/25/16   Nelva Nayobert Shaquetta Arcos, MD  HYDROcodone-homatropine [facility](HYCODAN) 5-1.5 MG/5ML syrup Take 5 mLs by mouth every 6 (six) hours as needed for cough. 04/25/16   Nelva Nayobert Xylan Sheils, MD  nicotine (EQ NICOTINE) 14 mg/24hr patch Place 1 patch (14 mg total) onto the skin daily. 04/25/16   Nelva Nayobert Lenis Nettleton, MD  predniSONE (DELTASONE) 10 MG tablet Take 4 tablets (40 mg total) by mouth daily. 04/25/16   Nelva Nayobert Rhena Glace, MD  BP 134/70 mmHg  Pulse 79  Temp(Src) 98.8 F (37.1 C) (Oral)  Resp 16  SpO2 96% Physical Exam Physical Exam  Nursing note and vitals reviewed. Constitutional: She is oriented to person, place, and time. She appears well-developed and well-nourished.  Patient appears in mild respiratory distress.   HENT:  Head: Normocephalic and atraumatic.  Eyes: Pupils are equal, round, and reactive to light.  Neck: Normal range of motion.  Cardiovascular: Normal rate and intact distal pulses.   Pulmonary/Chest: Mild respiratory distress with expiratory wheezes auscultated in all lung fields posteriorly  Abdominal: Normal appearance. She exhibits no distension.  Musculoskeletal: Normal range of motion.  Neurological: She is alert  and oriented to person, place, and time. No cranial nerve deficit.  Skin: Skin is warm and dry. No rash noted.  Psychiatric: She has a normal mood and affect. Her behavior is normal.   ED Course  Procedures (including critical care time) Medications  albuterol (PROVENTIL) (2.5 MG/3ML) 0.083% nebulizer solution 5 mg (5 mg Nebulization Given 04/25/16 1010)  ipratropium (ATROVENT) nebulizer solution 0.5 mg (0.5 mg Nebulization Given 04/25/16 1010)  lisinopril (PRINIVIL,ZESTRIL) tablet 10 mg (10 mg Oral Given 04/25/16 1009)  methylPREDNISolone sodium succinate (SOLU-MEDROL) 125 mg/2 mL injection 125 mg (125 mg Intravenous Given 04/25/16 1131)  albuterol (PROVENTIL) (2.5 MG/3ML) 0.083% nebulizer solution 5 mg (5 mg Nebulization Given 04/25/16 1129)  oxyCODONE-acetaminophen (PERCOCET/ROXICET) 5-325 MG per tablet 1 tablet (1 tablet Oral Given 04/25/16 1218)    Labs Review Labs Reviewed  BASIC METABOLIC PANEL - Abnormal; Notable for the following:    Calcium 8.4 (*)    All other components within normal limits  CBC WITH DIFFERENTIAL/PLATELET    Results for orders placed or performed during the hospital encounter of 04/25/16  CBC with Differential/Platelet  Result Value Ref Range   WBC 9.3 4.0 - 10.5 K/uL   RBC 4.22 3.87 - 5.11 MIL/uL   Hemoglobin 13.4 12.0 - 15.0 g/dL   HCT 29.541.9 62.136.0 - 30.846.0 %   MCV 99.3 78.0 - 100.0 fL   MCH 31.8 26.0 - 34.0 pg   MCHC 32.0 30.0 - 36.0 g/dL   RDW 65.713.5 84.611.5 - 96.215.5 %   Platelets 230 150 - 400 K/uL   Neutrophils Relative % 59 %   Neutro Abs 5.4 1.7 - 7.7 K/uL   Lymphocytes Relative 32 %   Lymphs Abs 3.0 0.7 - 4.0 K/uL   Monocytes Relative 7 %   Monocytes Absolute 0.7 0.1 - 1.0 K/uL   Eosinophils Relative 2 %   Eosinophils Absolute 0.2 0.0 - 0.7 K/uL   Basophils Relative 0 %   Basophils Absolute 0.0 0.0 - 0.1 K/uL  Basic metabolic panel  Result Value Ref Range   Sodium 137 135 - 145 mmol/L   Potassium 5.0 3.5 - 5.1 mmol/L   Chloride 108 101 - 111 mmol/L    CO2 24 22 - 32 mmol/L   Glucose, Bld 83 65 - 99 mg/dL   BUN 11 6 - 20 mg/dL   Creatinine, Ser 9.520.69 0.44 - 1.00 mg/dL   Calcium 8.4 (L) 8.9 - 10.3 mg/dL   GFR calc non Af Amer >60 >60 mL/min   GFR calc Af Amer >60 >60 mL/min   Anion gap 5 5 - 15   Dg Chest 2 View  04/25/2016  CLINICAL DATA:  Cough and chest pain. Recently treated for pneumonia. EXAM: CHEST  2 VIEW COMPARISON:  04/06/2016 and earlier chest radiographs FINDINGS: The cardiomediastinal silhouette is  within normal limits. The lungs are well inflated. Ill-defined bilateral perihilar opacities on the prior study have resolved. Mild interstitial densities in both lung bases are chronic in appearance and unchanged from 08/20/2015. There is no evidence of new airspace consolidation, edema, pleural effusion, or pneumothorax. No acute osseous abnormality is identified. IMPRESSION: Interval resolution of perihilar infiltrates. No evidence of new abnormality. Electronically Signed   By: Sebastian Ache M.D.   On: 04/25/2016 10:28       MDM   Final diagnoses:  Mixed simple and mucopurulent chronic bronchitis (HCC)        Nelva Nay, MD 04/28/16 1247

## 2016-04-25 NOTE — ED Notes (Signed)
H/a x 2 days , just finished her meds for pneumonia last week not better, sharp burning in lungs, pt still smoking she states she is coughing up a lot of phlegm more than before

## 2016-04-25 NOTE — ED Notes (Signed)
Pt states she has not had her BP med this am has been under stress caring for 2 sick parents

## 2016-04-25 NOTE — Discharge Instructions (Signed)
Chronic Bronchitis Chronic bronchitis is a lasting inflammation of the bronchial tubes, which are the tubes that carry air into your lungs. This is inflammation that occurs:   On most days of the week.   For at least three months at a time.   Over a period of two years in a row. When the bronchial tubes are inflamed, they start to produce mucus. The inflammation and buildup of mucus make it more difficult to breathe. Chronic bronchitis is usually a permanent problem and is one type of chronic obstructive pulmonary disease (COPD). People with chronic bronchitis are at greater risk for getting repeated colds, or respiratory infections. CAUSES  Chronic bronchitis most often occurs in people who have:  Long-standing, severe asthma.  A history of smoking.  Asthma and who also smoke. SIGNS AND SYMPTOMS  Chronic bronchitis may cause the following:   A cough that brings up mucus (productive cough).  Shortness of breath.  Early morning headache.  Wheezing.  Chest discomfort.   Recurring respiratory infections. DIAGNOSIS  Your health care provider may confirm the diagnosis by:  Taking your medical history.  Performing a physical exam.  Taking a chest X-ray.   Performing pulmonary function tests. TREATMENT  Treatment involves controlling symptoms with medicines, oxygen therapy, or making lifestyle changes, such as exercising and eating a healthy, well-balanced diet. Medicines could include:  Inhalers to improve air flow in and out of your lungs.  Antibiotics to treat bacterial infections, such as pneumonia, sinus infections, and acute bronchitis. As a preventative measure, your health care provider may recommend routine vaccinations for influenza and pneumonia. This is to prevent infection and hospitalization since you may be more at risk for these types of infections.  HOME CARE INSTRUCTIONS  Take medicines only as directed by your health care provider.   If you smoke  cigarettes, chew tobacco, or use electronic cigarettes, quit. If you need help quitting, ask your health care provider.  Avoid pollen, dust, animal dander, molds, smoke, and other things that cause shortness of breath or wheezing attacks.  Talk to your health care provider about possible exercise routines. Regular exercise is very important to help you feel better.  If you are prescribed oxygen use at home follow these guidelines:  Never smoke while using oxygen. Oxygen does not burn or explode, but flammable materials will burn faster in the presence of oxygen.  Keep a fire extinguisher close by. Let your fire department know that you have oxygen in your home.  Warn visitors not to smoke near you when you are using oxygen. Put up "no smoking" signs in your home where you most often use the oxygen.  Regularly test your smoke detectors at home to make sure they work. If you receive care in your home from a nurse or other health care provider, he or she may also check to make sure your smoke detectors work.  Ask your health care provider whether you would benefit from a pulmonary rehabilitation program.  Do not wait to get medical care if you have any concerning symptoms. Delays could cause permanent injury and may be life threatening. SEEK MEDICAL CARE IF:  You have increased coughing or shortness of breath or both.  You have muscle aches.  You have chest pain.  Your mucus gets thicker.  Your mucus changes from clear or white to yellow, green, gray, or bloody. SEEK IMMEDIATE MEDICAL CARE IF:  Your usual medicines do not stop your wheezing.   You have increased difficulty breathing.     You have any problems with the medicine you are taking, such as a rash, itching, swelling, or trouble breathing. MAKE SURE YOU:   Understand these instructions.  Will watch your condition.  Will get help right away if you are not doing well or get worse.   This information is not intended to  replace advice given to you by your health care provider. Make sure you discuss any questions you have with your health care provider.   Document Released: 08/05/2006 Document Revised: 11/08/2014 Document Reviewed: 11/26/2013 Elsevier Interactive Patient Education 2016 [employer]  

## 2016-04-25 NOTE — ED Notes (Signed)
Pt up to restroom requesting cough med

## 2016-04-25 NOTE — ED Notes (Addendum)
Pt states breathing tx made her feel better, iv started at [phone]

## 2019-07-28 ENCOUNTER — Emergency Department (HOSPITAL_COMMUNITY): Payer: Medicare HMO

## 2019-07-28 ENCOUNTER — Emergency Department (HOSPITAL_COMMUNITY)
Admission: EM | Admit: 2019-07-28 | Discharge: 2019-07-28 | Disposition: A | Discharge status: Home / Self Care | Payer: Medicare HMO | Attending: Emergency Medicine | Admitting: Emergency Medicine

## 2019-07-28 ENCOUNTER — Other Ambulatory Visit: Payer: Self-pay

## 2019-07-28 ENCOUNTER — Encounter (HOSPITAL_COMMUNITY): Payer: Self-pay | Admitting: Emergency Medicine

## 2019-07-28 DIAGNOSIS — Z20822 Contact with and (suspected) exposure to covid-19: Secondary | ICD-10-CM

## 2019-07-28 DIAGNOSIS — I10 Essential (primary) hypertension: Secondary | ICD-10-CM | POA: Diagnosis not present

## 2019-07-28 DIAGNOSIS — F1210 Cannabis abuse, uncomplicated: Secondary | ICD-10-CM | POA: Insufficient documentation

## 2019-07-28 DIAGNOSIS — Z20828 Contact with and (suspected) exposure to other viral communicable diseases: Secondary | ICD-10-CM | POA: Insufficient documentation

## 2019-07-28 DIAGNOSIS — F17210 Nicotine dependence, cigarettes, uncomplicated: Secondary | ICD-10-CM | POA: Diagnosis not present

## 2019-07-28 DIAGNOSIS — R05 Cough: Secondary | ICD-10-CM | POA: Insufficient documentation

## 2019-07-28 DIAGNOSIS — R0602 Shortness of breath: Secondary | ICD-10-CM | POA: Insufficient documentation

## 2019-07-28 DIAGNOSIS — R059 Cough, unspecified: Secondary | ICD-10-CM

## 2019-07-28 DIAGNOSIS — Z79899 Other long term (current) drug therapy: Secondary | ICD-10-CM | POA: Diagnosis not present

## 2019-07-28 MED ORDER — PREDNISONE 20 MG PO TABS: 40.0000 mg | ORAL_TABLET | Freq: Every day | ORAL | 0 refills | Status: AC

## 2019-07-28 MED ORDER — ALBUTEROL SULFATE HFA 108 (90 BASE) MCG/ACT IN AERS: 2.0000 | INHALATION_SPRAY | RESPIRATORY_TRACT | 0 refills | Status: AC | PRN

## 2019-07-28 MED ORDER — PREDNISONE 20 MG PO TABS
40.0000 mg | ORAL_TABLET | Freq: Once | ORAL | Status: AC
Start: 2019-07-28 — End: 2019-07-28
  Administered 2019-07-28: 21:00:00 40 mg via ORAL
  Filled 2019-07-28: qty 2

## 2019-07-28 MED ORDER — ALBUTEROL SULFATE HFA 108 (90 BASE) MCG/ACT IN AERS
2.0000 | INHALATION_SPRAY | Freq: Once | RESPIRATORY_TRACT | Status: AC
  Administered 2019-07-28: 2 via RESPIRATORY_TRACT
  Filled 2019-07-28: qty 6.7

## 2019-07-28 MED ORDER — SODIUM CHLORIDE 0.9 % IV BOLUS
500.0000 mL | Freq: Once | INTRAVENOUS | Status: AC
  Administered 2019-07-28: 500 mL via INTRAVENOUS

## 2019-07-28 NOTE — ED Triage Notes (Addendum)
Pt c/o cough and shortness of breath x several days. Pt also c/o nausea. Pt states her roommate was positive Covid. Pt states she was tested at [facility] about 2 weeks ago and it was negative and she was admitted for psych.

## 2019-07-28 NOTE — Discharge Instructions (Addendum)
You have very likely have COVID-19 virus, your test is pending.  Please continue to quarantine at home and monitor your symptoms closely. Antibiotics are not helpful in treating viral infection, the virus should run its course in about 10-14 days. Please make sure you are drinking plenty of fluids.  Use albuterol inhaler and take prednisone for the next 5 days.  You can treat your symptoms supportively with tylenol for fevers and pains, and over the counter cough syrups and throat lozenges to help with cough. If your symptoms are not improving please follow up with you Primary doctor.   I recommend that you purchase a home pulse ox to help better monitor your oxygen at home, if you start to have increased work of breathing or shortness of breath or your oxygen drops below 90% please immediately return to the hospital for reevaluation.  If you develop persistent fevers, shortness of breath or difficulty breathing, chest pain, severe headache and neck pain, persistent nausea and vomiting or other new or concerning symptoms return to the Emergency department.

## 2019-07-28 NOTE — ED Provider Notes (Signed)
[facility]-EMERGENCY DEPT Provider Note   CSN: [id number] Arrival date & time: 07/28/19  1733     History   Chief Complaint Chief Complaint  Patient presents with   Cough   Shortness of Breath    HPI Susan Bryan is a [age] y.o. female.     Susan Bryan is a 48 y.o. female with a history of lower disorder, hypertension who presents to the emergency department for evaluation of cough and shortness of breath the past few days.  She reports that today in particular she was not feeling well.  She reports associated chills, myalgias, fatigue, she has difficulty getting in a big deep breath, this tends to make her cough.  Cough is occasionally productive.  She reports decreased taste and smell.  Some slight nasal congestion and occasional sore throat.  No diarrhea or vomiting, does report some nausea.  No abdominal pain.  Patient reports that she was taking a nap this afternoon and she was not feeling well, when she woke up she found out that 1 of her roommates just received positive test results for COVID and the other has an inconclusive test, both are experiencing similar symptoms.  She reports in the past she has required an inhaler but has been out of it.  She is not used any other medications to try and treat symptoms.  No other aggravating or alleviating factors.     Past Medical History:  Diagnosis Date   Bipolar 1 disorder (HCC)    Family history of adverse reaction to anesthesia    ' my Mom did "   GERD (gastroesophageal reflux disease)    Hepatitis C    Hypertension    Pneumonia 08/2015   Polysubstance abuse [facility](HCC)     Patient Active Problem List   Diagnosis Date Noted   Personal history of asthma 08/17/2015   Hypertension 08/16/2015   Polysubstance abuse (HCC) 08/16/2015   Bipolar 1 disorder (HCC) 08/16/2015   Hepatitis C 08/16/2015   Community acquired pneumonia 08/16/2015    Past Surgical History:  Procedure Laterality  Date   ABDOMINAL HYSTERECTOMY     APPENDECTOMY     VASCULAR SURGERY       OB History   No obstetric history on file.      Home Medications    Prior to Admission medications   Medication Sig Start Date End Date Taking? Authorizing Provider  Aspirin-Acetaminophen (GOODYS BODY PAIN PO) Take 1 tablet by mouth 3 (three) times daily as needed (back pain).   Yes [provider]  buPROPion (WELLBUTRIN SR) 100 MG 12 hr tablet Take 100 mg by mouth daily.   Yes [provider]  lisinopril (PRINIVIL,ZESTRIL) 10 MG tablet Take 10 mg by mouth daily.   Yes [provider]  lurasidone (LATUDA) 40 MG TABS tablet Take 40 mg by mouth daily with breakfast.   Yes [provider]  omeprazole (PRILOSEC) 20 MG capsule Take 20 mg by mouth daily.   Yes [provider]  traZODone (DESYREL) 100 MG tablet Take 100 mg by mouth at bedtime. 02/26/15  Yes [provider]  albuterol (PROVENTIL) (2.5 MG/3ML) 0.083% nebulizer solution Take 3 mLs (2.5 mg total) by nebulization every 6 (six) hours as needed for wheezing or shortness of breath. Patient not taking: Reported on 07/28/2019 08/11/15   Eyvonne MechanicHedges, Jeffrey, PA-C  albuterol (VENTOLIN HFA) 108 (90 Base) MCG/ACT inhaler Inhale 2 puffs into the lungs every 4 (four) hours as needed for  wheezing or shortness of breath. 07/28/19   Jacqlyn Larsen, PA-C  doxycycline (VIBRAMYCIN) 100 MG capsule Take 1 capsule (100 mg total) by mouth 2 (two) times daily. Patient not taking: Reported on 07/28/2019 04/25/16   Leonard Schwartz, MD  HYDROcodone-homatropine [facility]) 5-1.5 MG/5ML syrup Take 5 mLs by mouth every 6 (six) hours as needed for cough. Patient not taking: Reported on 07/28/2019 04/25/16   Leonard Schwartz, MD  nicotine (EQ NICOTINE) 14 mg/24hr patch Place 1 patch (14 mg total) onto the skin daily. Patient not taking: Reported on 07/28/2019 04/25/16   Leonard Schwartz, MD  predniSONE (DELTASONE) 20 MG tablet Take 2 tablets (40 mg  total) by mouth daily for 5 days. 07/28/19 08/02/19  Jacqlyn Larsen, PA-C  clonazePAM (KLONOPIN) 1 MG tablet Take 2 mg by mouth once.  03/16/15  [provider]    Family History Family History  Problem Relation Age of Onset   Heart disease Mother    Heart disease Father     Social History Social History   Tobacco Use   Smoking status: Current Every Day Smoker    Packs/day: 1.00    Years: 20.00    Pack years: 20.00    Types: Cigarettes   Smokeless tobacco: Never Used  Substance Use Topics   Alcohol use: Yes    Comment: socially   Drug use: No    Types: Marijuana    Comment: Heroin.  claims stopped in July.     Allergies   Sulfur   Review of Systems Review of Systems  Constitutional: Positive for chills. Negative for fever.  HENT: Positive for congestion, rhinorrhea and sore throat.   Respiratory: Positive for cough and shortness of breath.   Cardiovascular: Negative for chest pain and leg swelling.  Gastrointestinal: Positive for nausea. Negative for abdominal pain, diarrhea and vomiting.  Genitourinary: Negative for dysuria and frequency.  Musculoskeletal: Positive for myalgias.  Skin: Negative for rash.  Neurological: Negative for headaches.  All other systems reviewed and are negative.    Physical Exam Updated Vital Signs BP (!) 139/93 (BP Location: Left Arm)    Pulse 65    Temp 97.7 F (36.5 C) (Oral)    Resp 16    Wt 87.5 kg    SpO2 100%    BMI 28.50 kg/m   Physical Exam Vitals signs and nursing note reviewed.  Constitutional:      General: She is not in acute distress.    Appearance: She is well-developed. She is not ill-appearing or diaphoretic.     Comments: Patient well-appearing and in no acute distress.  HENT:     Head: Normocephalic and atraumatic.     Nose:     Comments: Bilateral nares patent with moderate mucosal edema and clear rhinorrhea present.     Mouth/Throat:     Comments: Posterior oropharynx clear and mucous  membranes moist, there is mild erythema but no edema or tonsillar exudates, uvula midline, normal phonation, no trismus, tolerating secretions without difficulty. Eyes:     General:        Right eye: No discharge.        Left eye: No discharge.  Neck:     Musculoskeletal: Neck supple.     Comments: No rigidity Cardiovascular:     Rate and Rhythm: Normal rate and regular rhythm.     Heart sounds: Normal heart sounds. No murmur. No friction rub. No gallop.   Pulmonary:     Effort: Pulmonary effort is normal.  No respiratory distress.     Breath sounds: Normal breath sounds.     Comments: Respirations equal and unlabored, patient able to speak in full sentences, lungs clear to auscultation bilaterally Abdominal:     General: Bowel sounds are normal. There is no distension.     Palpations: Abdomen is soft. There is no mass.     Tenderness: There is no abdominal tenderness. There is no guarding.     Comments: Abdomen soft, nondistended, nontender to palpation in all quadrants without guarding or peritoneal signs  Musculoskeletal:        General: No deformity.     Right lower leg: No edema.     Left lower leg: No edema.  Lymphadenopathy:     Cervical: No cervical adenopathy.  Skin:    General: Skin is warm and dry.     Capillary Refill: Capillary refill takes less than 2 seconds.  Neurological:     Mental Status: She is alert and oriented to person, place, and time.     Coordination: Coordination normal.  Psychiatric:        Mood and Affect: Mood normal.        Behavior: Behavior normal.      ED Treatments / Results  Labs (all labs ordered are listed, but only abnormal results are displayed) Labs Reviewed  NOVEL CORONAVIRUS, NAA (HOSP ORDER, SEND-OUT TO REF LAB; TAT 18-24 HRS)    EKG EKG Interpretation  Date/Time:  Saturday July 28 2019 19:48:50 EDT Ventricular Rate:  63 PR Interval:    QRS Duration: 90 QT Interval:  414 QTC Calculation: 424 R Axis:   62 Text  Interpretation:  Sinus rhythm Ventricular premature complex No significant change since last tracing Confirmed by Richardean Canal ([id number]) on 07/28/2019 11:17:05 PM   Radiology Dg Chest Port 1 View  Result Date: 07/28/2019 CLINICAL DATA:  Cough and shortness of breath. Nausea. Roommate recently diagnosed COVID-19 positive. EXAM: PORTABLE CHEST 1 VIEW COMPARISON:  Radiograph 04/28/2018, additional priors. FINDINGS: The cardiomediastinal contours are normal. Questionable vague bibasilar opacities. Pulmonary vasculature is normal. No cough consolidation, pleural effusion, or pneumothorax. No acute osseous abnormalities are seen. IMPRESSION: Questionable vague bibasilar opacities, may reflect atelectasis or pneumonia. Electronically Signed   By: Narda Rutherford M.D.   On: 07/28/2019 19:16    Procedures Procedures (including critical care time)  Medications Ordered in ED Medications  sodium chloride 0.9 % bolus 500 mL (0 mLs Intravenous Stopped 07/28/19 2206)  albuterol (VENTOLIN HFA) 108 (90 Base) MCG/ACT inhaler 2 puff (2 puffs Inhalation Given 07/28/19 2033)  predniSONE (DELTASONE) tablet 40 mg (40 mg Oral Given 07/28/19 2033)     Initial Impression / Assessment and Plan / ED Course  I have reviewed the triage vital signs and the nursing notes.  Pertinent labs & imaging results that were available during my care of the patient were reviewed by me and considered in my medical decision making (see chart for details).  48 year old female presents with cough, shortness of breath, myalgias and fatigue as well as some congestion sore throat and decreased taste.  Symptoms present over the past few days and she just learned that 1 of her roommates who is been experiencing similar symptoms tested positive for COVID today.  She reports today in particular she has had increased shortness of breath.  Has run out of her inhaler so did not have any medications to use to treat the symptoms, no other  over-the-counter medications.  On arrival she has normal  vitals, O2 saturation ranging between 98-100% with ambulation in the room, no tachypnea.  Lungs are clear to auscultation.  No associated chest pain or lower extremity swelling.  Chest x-ray with some faint bibasilar opacities, given close contact with COVID positive patient I discussed with the patient that she very likely has coronavirus and should assume she is positive, test sent today but given very reassuring vitals will discharge patient home, she was given albuterol and short course of prednisone, strict return precautions and follow-up discussed.  Discharged home in good condition.  Susan Bryan was evaluated in Emergency Department on 07/28/2019 for the symptoms described in the history of present illness. She was evaluated in the context of the global COVID-19 pandemic, which necessitated consideration that the patient might be at risk for infection with the SARS-CoV-2 virus that causes COVID-19. Institutional protocols and algorithms that pertain to the evaluation of patients at risk for COVID-19 are in a state of rapid change based on information released by regulatory bodies including the CDC and federal and state organizations. These policies and algorithms were followed during the patient's care in the ED.  Final Clinical Impressions(s) / ED Diagnoses   Final diagnoses:  Suspected Covid-19 Virus Infection  Cough  SOB (shortness of breath)    ED Discharge Orders         Ordered    albuterol (VENTOLIN HFA) 108 (90 Base) MCG/ACT inhaler  Every 4 hours PRN     07/28/19 2244    predniSONE (DELTASONE) 20 MG tablet  Daily     07/28/19 2244           Dartha Lodge, PA-C 07/29/19 1531    Charlynne Pander, MD 07/29/19 [id number]

## 2019-07-30 LAB — NOVEL CORONAVIRUS, NAA (HOSP ORDER, SEND-OUT TO REF LAB; TAT 18-24 HRS)

## 2019-07-30 NOTE — ED Notes (Addendum)
07/30/2019 at [place] reported a result from COVID-19 testing that was inconclusive and patient will need to be retested. Both numbers in patient's chart were attempted to be contacted and both were unavailable. Day shift to be notified to attempt to contact patient.

## 2020-03-24 ENCOUNTER — Emergency Department (HOSPITAL_COMMUNITY)
Admission: EM | Admit: 2020-03-24 | Discharge: 2020-03-25 | Disposition: A | Discharge status: Home / Self Care | Payer: Medicare HMO | Attending: Emergency Medicine | Admitting: Emergency Medicine

## 2020-03-24 ENCOUNTER — Encounter (HOSPITAL_COMMUNITY): Payer: Self-pay | Admitting: *Deleted

## 2020-03-24 DIAGNOSIS — Z79899 Other long term (current) drug therapy: Secondary | ICD-10-CM | POA: Insufficient documentation

## 2020-03-24 DIAGNOSIS — G43009 Migraine without aura, not intractable, without status migrainosus: Secondary | ICD-10-CM | POA: Diagnosis not present

## 2020-03-24 DIAGNOSIS — I10 Essential (primary) hypertension: Secondary | ICD-10-CM | POA: Insufficient documentation

## 2020-03-24 DIAGNOSIS — F17210 Nicotine dependence, cigarettes, uncomplicated: Secondary | ICD-10-CM | POA: Diagnosis not present

## 2020-03-24 DIAGNOSIS — R519 Headache, unspecified: Secondary | ICD-10-CM | POA: Diagnosis present

## 2020-03-24 DIAGNOSIS — Z760 Encounter for issue of repeat prescription: Secondary | ICD-10-CM | POA: Insufficient documentation

## 2020-03-24 NOTE — ED Triage Notes (Addendum)
To ED via [facility] - with complaint of HA. States she is out of her psych meds. States she was recently discharged with scripts but unable to fill them due to lack of funds. Pt denies SI/HI. States she feels she is 'on the right path'. States she's been off heroin for 47 days. Tearful - states she misses her granddaughter and needs her meds so she can see her. Describes her HA as a migraine with intermittent episodes of 'lightening bolts' in head.

## 2020-03-25 MED ORDER — DIPHENHYDRAMINE HCL 25 MG PO CAPS
25.0000 mg | ORAL_CAPSULE | Freq: Once | ORAL | Status: AC
  Administered 2020-03-25: 25 mg via ORAL
  Filled 2020-03-25: qty 1

## 2020-03-25 MED ORDER — LURASIDONE HCL 40 MG PO TABS
40.0000 mg | ORAL_TABLET | Freq: Once | ORAL | Status: AC
  Administered 2020-03-25: 40 mg via ORAL
  Filled 2020-03-25: qty 1

## 2020-03-25 MED ORDER — PROCHLORPERAZINE EDISYLATE 10 MG/2ML IJ SOLN: 10.0000 mg | Freq: Once | INTRAMUSCULAR | Status: DC

## 2020-03-25 MED ORDER — TRAZODONE HCL 50 MG PO TABS
50.0000 mg | ORAL_TABLET | Freq: Every day | ORAL | Status: DC
  Administered 2020-03-25: 50 mg via ORAL
  Filled 2020-03-25: qty 1

## 2020-03-25 MED ORDER — PROMETHAZINE HCL 25 MG PO TABS
25.0000 mg | ORAL_TABLET | Freq: Four times a day (QID) | ORAL | Status: DC | PRN
  Administered 2020-03-25: 25 mg via ORAL
  Filled 2020-03-25: qty 1

## 2020-03-25 MED ORDER — TRAZODONE HCL 50 MG PO TABS: 50.0000 mg | ORAL_TABLET | Freq: Every day | ORAL | 0 refills | Status: DC

## 2020-03-25 MED ORDER — LURASIDONE HCL 40 MG PO TABS: 40.0000 mg | ORAL_TABLET | Freq: Every day | ORAL | 0 refills | Status: DC

## 2020-03-25 MED ORDER — DIPHENHYDRAMINE HCL 50 MG/ML IJ SOLN: 25.0000 mg | Freq: Once | INTRAMUSCULAR | Status: DC

## 2020-03-25 NOTE — Discharge Instructions (Addendum)
You were seen today for headache and requesting your psychiatric medications.  A social work consult was placed to help you with medication assistance.

## 2020-03-25 NOTE — ED Provider Notes (Signed)
[facility] EMERGENCY DEPARTMENT Provider Note   CSN: [id number] Arrival date & time: 03/24/20  1440     History Chief Complaint  Patient presents with  . out of meds  . Migraine    Susan Bryan is a 49 y.o. female.  HPI     This is a 49 year old female with a history of bipolar disorder, polysubstance abuse, hypertension who presents with headache.  Patient reports that she has a history of migraines.  She has had a headache for the last 3 days.  She states that she is also not slept in the last couple days and wonders whether not taking her medications is contributing to her headache and lack of sleep.  She describes the headache as pressure and worse when she takes steps.  She feels like there are "lightning bolts."  She states it is somewhat different than her prior headaches.  She rates her pain an 8 out of 10.  She states she is not taking any medications and she has been out of her "mental medications" since last Tuesday.  She states she cannot afford to get them filled at the moment.  She denies any SI, HI.  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  She denies any recent fevers or neck stiffness.  Denies worst headache of her life.  Denies any speech difficulty, weakness, numbness, strokelike symptoms.  Past Medical History:  Diagnosis Date  . Bipolar 1 disorder (HCC)   . Family history of adverse reaction to anesthesia    ' my Mom did "  . GERD (gastroesophageal reflux disease)   . Hepatitis C   . Hypertension   . Pneumonia 08/2015  . Polysubstance abuse [facility])     Patient Active Problem List   Diagnosis Date Noted  . Personal history of asthma 08/17/2015  . Hypertension 08/16/2015  . Polysubstance abuse (HCC) 08/16/2015  . Bipolar 1 disorder (HCC) 08/16/2015  . Hepatitis C 08/16/2015  . Community acquired pneumonia 08/16/2015    Past Surgical History:  Procedure Laterality Date  . ABDOMINAL HYSTERECTOMY    .  APPENDECTOMY    . VASCULAR SURGERY       OB History   No obstetric history on file.     Family History  Problem Relation Age of Onset  . Heart disease Mother   . Heart disease Father     Social History   Tobacco Use  . Smoking status: Current Every Day Smoker    Packs/day: 1.00    Years: 20.00    Pack years: 20.00    Types: Cigarettes  . Smokeless tobacco: Never Used  Substance Use Topics  . Alcohol use: Yes    Comment: socially  . Drug use: No    Types: Marijuana    Comment: Heroin.  claims stopped in July.    Home Medications Prior to Admission medications   Medication Sig Start Date End Date Taking? Authorizing Provider  Aspirin-Acetaminophen (GOODYS BODY PAIN PO) Take 1 Package by mouth daily as needed (back pain).    Yes [provider]  lisinopril (PRINIVIL,ZESTRIL) 10 MG tablet Take 10 mg by mouth daily.   Yes [provider]  omeprazole (PRILOSEC) 20 MG capsule Take 20 mg by mouth daily.   Yes [provider]  albuterol (PROVENTIL) (2.5 MG/3ML) 0.083% nebulizer solution Take 3 mLs (2.5 mg total) by nebulization every 6 (six) hours as needed for wheezing or shortness of breath. Patient not  taking: Reported on 07/28/2019 08/11/15   Eyvonne Mechanic, PA-C  albuterol (VENTOLIN HFA) 108 (90 Base) MCG/ACT inhaler Inhale 2 puffs into the lungs every 4 (four) hours as needed for wheezing or shortness of breath. Patient not taking: Reported on 03/25/2020 07/28/19   Dartha Lodge, PA-C  doxycycline (VIBRAMYCIN) 100 MG capsule Take 1 capsule (100 mg total) by mouth 2 (two) times daily. Patient not taking: Reported on 07/28/2019 04/25/16   Nelva Nay, MD  gabapentin (NEURONTIN) 600 MG tablet Take 600 mg by mouth 3 (three) times daily.  03/21/20   [provider]  HYDROcodone-homatropine (HYCODAN) 5-1.5 MG/5ML syrup Take 5 mLs by mouth every 6 (six) hours as needed for cough. Patient not taking: Reported on 07/28/2019 04/25/16   Nelva Nay,  MD  hydrOXYzine (VISTARIL) 25 MG capsule Take 1 capsule by mouth every 6 (six) hours as needed for anxiety. 03/21/20   [provider]  lurasidone (LATUDA) 40 MG TABS tablet Take 1 tablet (40 mg total) by mouth daily with breakfast. 03/25/20   Leonarda Leis, Mayer Masker, MD  nicotine (EQ NICOTINE) 14 mg/24hr patch Place 1 patch (14 mg total) onto the skin daily. Patient not taking: Reported on 07/28/2019 04/25/16   Nelva Nay, MD  traZODone (DESYREL) 50 MG tablet Take 1 tablet (50 mg total) by mouth at bedtime. 03/25/20   Christe Tellez, Mayer Masker, MD  clonazePAM (KLONOPIN) 1 MG tablet Take 2 mg by mouth once.  03/16/15  [provider]    Allergies    Sulfur  Review of Systems   Review of Systems  Constitutional: Negative for fever.  Respiratory: Negative for shortness of breath.   Cardiovascular: Negative for chest pain.  Gastrointestinal: Negative for abdominal pain, nausea and vomiting.  Musculoskeletal: Negative for neck pain.  Neurological: Positive for headaches. Negative for dizziness, weakness and numbness.  Psychiatric/Behavioral: Positive for sleep disturbance. Negative for suicidal ideas.  All other systems reviewed and are negative.   Physical Exam Updated Vital Signs BP (!) 151/92 (BP Location: Left Arm)   Pulse 86   Temp 98.6 F (37 C) (Oral)   Resp 12   SpO2 100%   Physical Exam Vitals and nursing note reviewed.  Constitutional:      Appearance: She is well-developed. She is not ill-appearing.  HENT:     Head: Normocephalic and atraumatic.     Nose: Nose normal.     Mouth/Throat:     Mouth: Mucous membranes are moist.  Eyes:     Pupils: Pupils are equal, round, and reactive to light.  Cardiovascular:     Rate and Rhythm: Normal rate and regular rhythm.     Heart sounds: Normal heart sounds.  Pulmonary:     Effort: Pulmonary effort is normal. No respiratory distress.     Breath sounds: No wheezing.  Abdominal:     General: Bowel sounds are normal.      Palpations: Abdomen is soft.     Tenderness: There is no abdominal tenderness.  Musculoskeletal:     Cervical back: Neck supple.     Right lower leg: No edema.     Left lower leg: No edema.  Skin:    General: Skin is warm and dry.  Neurological:     Mental Status: She is alert and oriented to person, place, and time.     Comments: Cranial nerves II through XII intact, 5 out of 5 strength in all 4 extremities, no dysmetria to finger-nose-finger  Psychiatric:  Comments: Cooperative, at times speech is slightly pressured     ED Results / Procedures / Treatments   Labs (all labs ordered are listed, but only abnormal results are displayed) Labs Reviewed - No data to display  EKG None  Radiology No results found.  Procedures Procedures (including critical care time)  Medications Ordered in ED Medications  traZODone (DESYREL) tablet 50 mg (has no administration in time range)  promethazine (PHENERGAN) tablet 25 mg (25 mg Oral Given 03/25/20 0413)  lurasidone (LATUDA) tablet 40 mg (40 mg Oral Given 03/25/20 0413)  diphenhydrAMINE (BENADRYL) capsule 25 mg (25 mg Oral Given 03/25/20 0413)    ED Course  I have reviewed the triage vital signs and the nursing notes.  Pertinent labs & imaging results that were available during my care of the patient were reviewed by me and considered in my medical decision making (see chart for details).    MDM Rules/Calculators/A&P                       Patient presents with headache.  Also requesting her medications.  She has been out of her [place] and trazodone since Tuesday.  She states she cannot afford to get these medications.  She is overall nontoxic and vital signs are reassuring.  She is neurologically intact.  No red flags for headache on exam.  Doubt subarachnoid hemorrhage or meningitis.  Patient with history of migraines.  This is likely the case especially given recent sleep deprivation.  Patient was given a dose of Latuda and  trazodone.  She was also given a migraine cocktail.  Will place social work consult to call the patient for medication assistance.  Do not feel she needs imaging or any emergent lab evaluation at this time.  After history, exam, and medical workup I feel the patient has been appropriately medically screened and is safe for discharge home. Pertinent diagnoses were discussed with the patient. Patient was given return precautions.   Final Clinical Impression(s) / ED Diagnoses Final diagnoses:  Migraine without aura and without status migrainosus, not intractable  Medication refill    Rx / DC Orders ED Discharge Orders         Ordered    lurasidone (LATUDA) 40 MG TABS tablet  Daily with breakfast     03/25/20 0421    traZODone (DESYREL) 50 MG tablet  Daily at bedtime     03/25/20 0421           Shayley Medlin, Barbette Hair, MD 03/25/20 [id number]

## 2020-07-09 ENCOUNTER — Other Ambulatory Visit: Payer: Self-pay

## 2020-07-09 ENCOUNTER — Encounter (HOSPITAL_COMMUNITY): Payer: Self-pay | Admitting: Emergency Medicine

## 2020-07-09 ENCOUNTER — Ambulatory Visit (HOSPITAL_COMMUNITY)
Admission: RE | Admit: 2020-07-09 | Discharge: 2020-07-09 | Disposition: A | Discharge status: Home / Self Care | Payer: Medicare HMO | Source: Home / Self Care | Attending: Psychiatry | Admitting: Psychiatry

## 2020-07-09 ENCOUNTER — Ambulatory Visit (HOSPITAL_COMMUNITY)
Admission: EM | Admit: 2020-07-09 | Discharge: 2020-07-10 | Disposition: A | Discharge status: Home / Self Care | Payer: Medicare HMO | Attending: Registered Nurse | Admitting: Registered Nurse

## 2020-07-09 DIAGNOSIS — R45851 Suicidal ideations: Secondary | ICD-10-CM | POA: Diagnosis present

## 2020-07-09 DIAGNOSIS — R440 Auditory hallucinations: Secondary | ICD-10-CM | POA: Insufficient documentation

## 2020-07-09 DIAGNOSIS — F319 Bipolar disorder, unspecified: Secondary | ICD-10-CM | POA: Insufficient documentation

## 2020-07-09 DIAGNOSIS — F603 Borderline personality disorder: Secondary | ICD-10-CM | POA: Insufficient documentation

## 2020-07-09 DIAGNOSIS — Z79899 Other long term (current) drug therapy: Secondary | ICD-10-CM | POA: Insufficient documentation

## 2020-07-09 DIAGNOSIS — F17210 Nicotine dependence, cigarettes, uncomplicated: Secondary | ICD-10-CM | POA: Diagnosis not present

## 2020-07-09 DIAGNOSIS — Z20822 Contact with and (suspected) exposure to covid-19: Secondary | ICD-10-CM | POA: Insufficient documentation

## 2020-07-09 DIAGNOSIS — I10 Essential (primary) hypertension: Secondary | ICD-10-CM | POA: Diagnosis not present

## 2020-07-09 DIAGNOSIS — K219 Gastro-esophageal reflux disease without esophagitis: Secondary | ICD-10-CM | POA: Diagnosis not present

## 2020-07-09 DIAGNOSIS — Z915 Personal history of self-harm: Secondary | ICD-10-CM | POA: Insufficient documentation

## 2020-07-09 DIAGNOSIS — F419 Anxiety disorder, unspecified: Secondary | ICD-10-CM | POA: Insufficient documentation

## 2020-07-09 DIAGNOSIS — Z91410 Personal history of adult physical and sexual abuse: Secondary | ICD-10-CM | POA: Insufficient documentation

## 2020-07-09 DIAGNOSIS — F19159 Other psychoactive substance abuse with psychoactive substance-induced psychotic disorder, unspecified: Secondary | ICD-10-CM | POA: Insufficient documentation

## 2020-07-09 DIAGNOSIS — F4310 Post-traumatic stress disorder, unspecified: Secondary | ICD-10-CM | POA: Insufficient documentation

## 2020-07-09 DIAGNOSIS — F22 Delusional disorders: Secondary | ICD-10-CM | POA: Insufficient documentation

## 2020-07-09 LAB — CBC WITH DIFFERENTIAL/PLATELET
Abs Immature Granulocytes: 0.1 10*3/uL — ABNORMAL HIGH (ref 0.00–0.07)
Basophils Absolute: 0.1 10*3/uL (ref 0.0–0.1)
Basophils Relative: 1 %
Eosinophils Absolute: 0.3 10*3/uL (ref 0.0–0.5)
Eosinophils Relative: 3 %
HCT: 42.9 % (ref 36.0–46.0)
Hemoglobin: 14.2 g/dL (ref 12.0–15.0)
Immature Granulocytes: 1 %
Lymphocytes Relative: 33 %
Lymphs Abs: 3.4 10*3/uL (ref 0.7–4.0)
MCH: 32.5 pg (ref 26.0–34.0)
MCHC: 33.1 g/dL (ref 30.0–36.0)
MCV: 98.2 fL (ref 80.0–100.0)
Monocytes Absolute: 0.7 10*3/uL (ref 0.1–1.0)
Monocytes Relative: 7 %
Neutro Abs: 5.8 10*3/uL (ref 1.7–7.7)
Neutrophils Relative %: 55 %
Platelets: 333 10*3/uL (ref 150–400)
RBC: 4.37 MIL/uL (ref 3.87–5.11)
RDW: 13.3 % (ref 11.5–15.5)
WBC: 10.4 10*3/uL (ref 4.0–10.5)
nRBC: 0 % (ref 0.0–0.2)

## 2020-07-09 LAB — LIPID PANEL
Cholesterol: 212 mg/dL — ABNORMAL HIGH (ref 0–200)
HDL: 54 mg/dL (ref >40)
LDL Cholesterol: 131 mg/dL — ABNORMAL HIGH (ref 0–99)
Total CHOL/HDL Ratio: 3.9 RATIO
Triglycerides: 136 mg/dL (ref <150)
VLDL: 27 mg/dL (ref 0–40)

## 2020-07-09 LAB — POCT URINALYSIS DIP (DEVICE)
Bilirubin Urine: NEGATIVE
Glucose, UA: NEGATIVE mg/dL
Hgb urine dipstick: NEGATIVE
Ketones, ur: NEGATIVE mg/dL
Leukocytes,Ua: NEGATIVE
Nitrite: NEGATIVE
Protein, ur: NEGATIVE mg/dL
Specific Gravity, Urine: 1.025 (ref 1.005–1.030)
Urobilinogen, UA: 0.2 mg/dL (ref 0.0–1.0)
pH: 6 (ref 5.0–8.0)

## 2020-07-09 LAB — ETHANOL: Alcohol, Ethyl (B): <10 mg/dL (ref <10)

## 2020-07-09 LAB — COMPREHENSIVE METABOLIC PANEL
ALT: 12 U/L (ref 0–44)
AST: 12 U/L — ABNORMAL LOW (ref 15–41)
Albumin: 3.9 g/dL (ref 3.5–5.0)
Alkaline Phosphatase: 55 U/L (ref 38–126)
Anion gap: 9 (ref 5–15)
BUN: 11 mg/dL (ref 6–20)
CO2: 24 mmol/L (ref 22–32)
Calcium: 9.2 mg/dL (ref 8.9–10.3)
Chloride: 105 mmol/L (ref 98–111)
Creatinine, Ser: 0.69 mg/dL (ref 0.44–1.00)
GFR calc Af Amer: >60 mL/min (ref >60)
GFR calc non Af Amer: >60 mL/min (ref >60)
Glucose, Bld: 130 mg/dL — ABNORMAL HIGH (ref 70–99)
Potassium: 3.8 mmol/L (ref 3.5–5.1)
Sodium: 138 mmol/L (ref 135–145)
Total Bilirubin: 0.2 mg/dL — ABNORMAL LOW (ref 0.3–1.2)
Total Protein: 6.1 g/dL — ABNORMAL LOW (ref 6.5–8.1)

## 2020-07-09 LAB — POCT URINE DRUG SCREEN - MANUAL ENTRY (I-SCREEN)
POC Amphetamine UR: NOT DETECTED
POC Buprenorphine (BUP): NOT DETECTED
POC Cocaine UR: NOT DETECTED
POC Marijuana UR: NOT DETECTED
POC Methadone UR: NOT DETECTED
POC Methamphetamine UR: NOT DETECTED
POC Morphine: NOT DETECTED
POC Oxazepam (BZO): NOT DETECTED
POC Oxycodone UR: NOT DETECTED
POC Secobarbital (BAR): NOT DETECTED

## 2020-07-09 LAB — TSH: TSH: 1.186 u[IU]/mL (ref 0.350–4.500)

## 2020-07-09 LAB — POC SARS CORONAVIRUS 2 AG -  ED: SARS Coronavirus 2 Ag: NEGATIVE

## 2020-07-09 LAB — SARS CORONAVIRUS 2 BY RT PCR (HOSPITAL ORDER, PERFORMED IN ~~LOC~~ HOSPITAL LAB): SARS Coronavirus 2: NEGATIVE

## 2020-07-09 MED ORDER — LORAZEPAM 1 MG PO TABS
1.0000 mg | ORAL_TABLET | Freq: Once | ORAL | Status: AC
  Administered 2020-07-09: 1 mg via ORAL
  Filled 2020-07-09: qty 1

## 2020-07-09 MED ORDER — ALBUTEROL SULFATE HFA 108 (90 BASE) MCG/ACT IN AERS: 2.0000 | INHALATION_SPRAY | RESPIRATORY_TRACT | Status: DC | PRN

## 2020-07-09 MED ORDER — NICOTINE POLACRILEX 2 MG MT GUM
2.0000 mg | CHEWING_GUM | OROMUCOSAL | Status: DC | PRN
  Administered 2020-07-09 – 2020-07-10 (×2): 2 mg via ORAL
  Filled 2020-07-09 (×2): qty 1

## 2020-07-09 MED ORDER — TRAZODONE HCL 50 MG PO TABS
50.0000 mg | ORAL_TABLET | Freq: Every day | ORAL | Status: DC
  Administered 2020-07-09: 50 mg via ORAL
  Filled 2020-07-09: qty 1

## 2020-07-09 MED ORDER — PROPRANOLOL HCL 10 MG PO TABS
20.0000 mg | ORAL_TABLET | Freq: Once | ORAL | Status: AC
  Administered 2020-07-09: 20 mg via ORAL
  Filled 2020-07-09: qty 2

## 2020-07-09 MED ORDER — HYDROXYZINE PAMOATE 25 MG PO CAPS
25.0000 mg | ORAL_CAPSULE | Freq: Four times a day (QID) | ORAL | Status: DC | PRN
  Filled 2020-07-09: qty 1

## 2020-07-09 MED ORDER — LURASIDONE HCL 40 MG PO TABS
40.0000 mg | ORAL_TABLET | Freq: Every day | ORAL | Status: DC
  Administered 2020-07-10: 40 mg via ORAL
  Filled 2020-07-09: qty 1
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
## 2020-07-09 NOTE — ED Notes (Signed)
Pt A&O x 4, presents with mania, paranoia, auditory hallucinations and off meds.  Denies SI or HI.  Pt feels her meds are not working, experiencing auditory hallucinations of whispers.  Skin search completed.  Monitoring for safety. Anxiety meds given.  Monitoring for safety, no distress noted,  Pt cooperative at present.
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
## 2020-07-09 NOTE — ED Triage Notes (Signed)
Pt presents with mania and paranoia, hearing voices also.

## 2020-07-09 NOTE — ED Provider Notes (Signed)
Behavioral Health Admission H&P [facility])  Date: 07/09/20 Patient Name: Susan Bryan MRN: [id number] Chief Complaint: No chief complaint on file.     Diagnoses:  Final diagnoses:  None    HPI: Patient sent from [facility] where she was seen as a walk in.  Patient seen by this provider face to face.  Patient reports that she is depressed with plan to cut herself.  States that she is hearing voices that are whispering.  Patient states that she has been on Abilify 10 mg for two weeks and feels like she is having side effect; states she was on Latuda in the past and it helped with her voices and she didn't have any side effect.  Patient states with Abilify she can't stay still, anxious, restless.  Patient admitted to continuous assessment; Latuda 20 mg ordered and Propanolol 20 mg ordered for akathisia   PHQ 2-9:     Total Time spent with patient: 30 minutes  Musculoskeletal  Strength & Muscle Tone: within normal limits Gait & Station: normal Patient leans: N/A  Psychiatric Specialty Exam  Presentation General Appearance: Appropriate for Environment  Eye Contact:Good  Speech:Clear and Coherent;Normal Rate  Speech Volume:Normal  Handedness:Right   Mood and Affect  Mood:Anxious  Affect:Labile   Thought Process  Thought Processes:Coherent;Goal Directed  Descriptions of Associations:Intact  Orientation:Full (Time, Place and Person)  Thought Content:Tangential  Hallucinations:Hallucinations: Auditory Description of Auditory Hallucinations: whispers  Ideas of Reference:None  Suicidal Thoughts:Suicidal Thoughts: Yes, Active SI Active Intent and/or Plan: Without Intent;With Plan;With Means to Carry Out  Homicidal Thoughts:Homicidal Thoughts: No   Sensorium  Memory:Immediate Good;Recent Good  Judgment:Intact  Insight:Present   Executive Functions  Concentration:Fair  Attention Span:Fair  Recall:Good  Fund of  Knowledge:Fair  Language:Good   Psychomotor Activity  Psychomotor Activity:Psychomotor Activity: Extrapyramidal Side Effects (EPS);Restlessness Extrapyramidal Side Effects (EPS): Akathisia AIMS Completed?: No   Assets  Assets:Communication Skills;Desire for Improvement;Social Support;Housing   Sleep  Sleep:Sleep: Fair   Physical Exam ROS  Blood pressure (!) 144/78, pulse 81, temperature 97.6 F (36.4 C), temperature source Oral, resp. rate 18, height 5\' 9"  (1.753 m), weight 205 lb (93 kg), SpO2 100 %. Body mass index is 30.27 kg/m.  Past Psychiatric History: bipolar disorder   Is the patient at risk to self? Yes  Has the patient been a risk to self in the past 6 months? Yes .    Has the patient been a risk to self within the distant past? No   Is the patient a risk to others? No   Has the patient been a risk to others in the past 6 months? No   Has the patient been a risk to others within the distant past? No   Past Medical History:  Past Medical History:  Diagnosis Date  . Bipolar 1 disorder (HCC)   . Family history of adverse reaction to anesthesia    ' my Mom did "  . GERD (gastroesophageal reflux disease)   . Hepatitis C   . Hypertension   . Pneumonia 08/2015  . Polysubstance abuse [facility])     Past Surgical History:  Procedure Laterality Date  . ABDOMINAL HYSTERECTOMY    . APPENDECTOMY    . VASCULAR SURGERY      Family History:  Family History  Problem Relation Age of Onset  . Heart disease Mother   . Heart disease Father     Social History:  Social History   Socioeconomic History  . Marital status:  Single    Spouse name: Not on file  . Number of children: Not on file  . Years of education: Not on file  . Highest education level: Not on file  Occupational History  . Not on file  Tobacco Use  . Smoking status: Current Every Day Smoker    Packs/day: 1.00    Years: 20.00    Pack years: 20.00    Types: Cigarettes  . Smokeless tobacco: Never  Used  Vaping Use  . Vaping Use: Every day  Substance and Sexual Activity  . Alcohol use: Yes    Comment: socially  . Drug use: No    Types: Marijuana    Comment: Heroin.  claims stopped in July.  Marland Kitchen Sexual activity: Not Currently  Other Topics Concern  . Not on file  Social History Narrative  . Not on file   Social Determinants of Health   Financial Resource Strain:   . Difficulty of Paying Living Expenses: Not on file  Food Insecurity:   . Worried About Programme researcher, broadcasting/film/video in the Last Year: Not on file  . Ran Out of Food in the Last Year: Not on file  Transportation Needs:   . Lack of Transportation (Medical): Not on file  . Lack of Transportation (Non-Medical): Not on file  Physical Activity:   . Days of Exercise per Week: Not on file  . Minutes of Exercise per Session: Not on file  Stress:   . Feeling of Stress : Not on file  Social Connections:   . Frequency of Communication with Friends and Family: Not on file  . Frequency of Social Gatherings with Friends and Family: Not on file  . Attends Religious Services: Not on file  . Active Member of Clubs or Organizations: Not on file  . Attends Banker Meetings: Not on file  . Marital Status: Not on file  Intimate Partner Violence:   . Fear of Current or Ex-Partner: Not on file  . Emotionally Abused: Not on file  . Physically Abused: Not on file  . Sexually Abused: Not on file    SDOH:  SDOH Screenings   Alcohol Screen:   . Last Alcohol Screening Score (AUDIT): Not on file  Depression (PHQ2-9):   . PHQ-2 Score: Not on file  Financial Resource Strain:   . Difficulty of Paying Living Expenses: Not on file  Food Insecurity:   . Worried About Programme researcher, broadcasting/film/video in the Last Year: Not on file  . Ran Out of Food in the Last Year: Not on file  Housing:   . Last Housing Risk Score: Not on file  Physical Activity:   . Days of Exercise per Week: Not on file  . Minutes of Exercise per Session: Not on file   Social Connections:   . Frequency of Communication with Friends and Family: Not on file  . Frequency of Social Gatherings with Friends and Family: Not on file  . Attends Religious Services: Not on file  . Active Member of Clubs or Organizations: Not on file  . Attends Banker Meetings: Not on file  . Marital Status: Not on file  Stress:   . Feeling of Stress : Not on file  Tobacco Use: High Risk  . Smoking Tobacco Use: Current Every Day Smoker  . Smokeless Tobacco Use: Never Used  Transportation Needs:   . Freight forwarder (Medical): Not on file  . Lack of Transportation (Non-Medical): Not on file  Last Labs:  Admission on 07/09/2020  Component Date Value Ref Range Status  . SARS Coronavirus 2 Ag 07/09/2020 Negative  Negative Final    Allergies: Sulfur  PTA Medications: (Not in a hospital admission)   Medical Decision Making  Admitted to continuous assessment Restarted Latuda; and Propanolol given for akathisia  (see above) Routine labs/EKG ordered    Recommendations  Based on my evaluation the patient does not appear to have an emergency medical condition.  Brocha Gilliam, NP 07/09/20  7:43 PM

## 2020-07-10 DIAGNOSIS — F319 Bipolar disorder, unspecified: Secondary | ICD-10-CM | POA: Diagnosis not present

## 2020-07-10 MED ORDER — TRAZODONE HCL 100 MG PO TABS: 100.0000 mg | ORAL_TABLET | Freq: Every day | ORAL | 0 refills | Status: AC

## 2020-07-10 MED ORDER — LURASIDONE HCL 40 MG PO TABS: 40.0000 mg | ORAL_TABLET | Freq: Every day | ORAL | 0 refills | Status: AC

## 2020-07-10 MED ORDER — ACETAMINOPHEN 325 MG PO TABS
650.0000 mg | ORAL_TABLET | Freq: Once | ORAL | Status: AC
  Administered 2020-07-10: 650 mg via ORAL
  Filled 2020-07-10: qty 2

## 2020-07-10 MED ORDER — LISINOPRIL 10 MG PO TABS: 10.0000 mg | ORAL_TABLET | Freq: Every day | ORAL | 0 refills | Status: AC

## 2020-07-10 MED ORDER — HYDROXYZINE HCL 25 MG PO TABS: 25.0000 mg | ORAL_TABLET | Freq: Four times a day (QID) | ORAL | Status: DC | PRN

## 2020-07-10 MED ORDER — TRAZODONE HCL 100 MG PO TABS: 100.0000 mg | ORAL_TABLET | Freq: Every day | ORAL | Status: DC

## 2020-07-10 MED ORDER — ACETAMINOPHEN 325 MG PO TABS
ORAL_TABLET | ORAL | Status: AC
  Administered 2020-07-10: 650 mg
  Filled 2020-07-10: qty 2

## 2020-07-10 NOTE — Discharge Instructions (Signed)
Continue activity as tolerated. Continue diet as recommended by your PCP. Ensure to keep all appointments with outpatient providers.  

## 2020-07-10 NOTE — ED Notes (Signed)
Pt discharged in no acute distress. Verbalized understanding of all discharge instructions. Safety maintained. Pt escorted to lobby via staff where Benedetto Goad driver was waiting to transport pt to home.

## 2020-07-10 NOTE — ED Provider Notes (Signed)
FBC/OBS ASAP Discharge Summary  Date and Time: 07/10/2020 9:46 AM  Name: Susan Bryan  MRN:  [id number]   Discharge Diagnoses:  Final diagnoses:  Bipolar 1 disorder (HCC)    Subjective: "I am ready to go. I wanted to go inpatient to be started back on my medications, but since I am back on my medications I can leave."   Stay Summary: The patient was seen and examined face to face. She is alert, oriented x 4 and goal oriented. Her speech is logical/coherent with normal rate and tone. She denies suicidal and homicidal ideations. She continues to report auditory hallucinations but states "I hear people whispering about me." She reported that she is able to tolerate the voices. She stated, "I know the voices are in my head because I am clean and sober from drugs." She denies visual hallucinations. She reported fair sleep, sleeping a couple of hours at a time and would like the Trazodone to be increased at night.  We discussed outpatient resources to include medication management and therapy. She stated that she would like to continue following up with [facility] in [place] and has an upcoming appointment on July 24, 2020. She reported that she will be returning back to the recover house at [facility] in [place]. Patient verbalizes that Arline Asp is supportive at the [facility] recovery house and she feels safe to return. Patient will be transferred to [facility] via [employer]. Patient's medications were e-prescribed to pharmacy of choice. Patient does not meet inpatient criteria and is psychiatrically cleared.   Labs reviewed. Vital signs reviewed. Medications reviewed and discussed with the patient.   Total Time spent with patient: 30 minutes  Past Psychiatric History: Bipolar 1 disorder  Past Medical History:  Past Medical History:  Diagnosis Date  . Bipolar 1 disorder (HCC)   . Family history of adverse reaction to anesthesia    ' my Mom did "  . GERD (gastroesophageal  reflux disease)   . Hepatitis C   . Hypertension   . Pneumonia 08/2015  . Polysubstance abuse [facility])     Past Surgical History:  Procedure Laterality Date  . ABDOMINAL HYSTERECTOMY    . APPENDECTOMY    . VASCULAR SURGERY     Family History:  Family History  Problem Relation Age of Onset  . Heart disease Mother   . Heart disease Father    Family Psychiatric History: Unknown  Social History:  Social History   Substance and Sexual Activity  Alcohol Use Yes   Comment: socially     Social History   Substance and Sexual Activity  Drug Use No  . Types: Marijuana   Comment: Heroin.  claims stopped in July.    Social History   Socioeconomic History  . Marital status: Single    Spouse name: Not on file  . Number of children: Not on file  . Years of education: Not on file  . Highest education level: Not on file  Occupational History  . Not on file  Tobacco Use  . Smoking status: Current Every Day Smoker    Packs/day: 1.00    Years: 20.00    Pack years: 20.00    Types: Cigarettes  . Smokeless tobacco: Never Used  Vaping Use  . Vaping Use: Every day  Substance and Sexual Activity  . Alcohol use: Yes    Comment: socially  . Drug use: No    Types: Marijuana    Comment: Heroin.  claims stopped in  July.  . Sexual activity: Not Currently  Other Topics Concern  . Not on file  Social History Narrative  . Not on file   Social Determinants of Health   Financial Resource Strain:   . Difficulty of Paying Living Expenses: Not on file  Food Insecurity:   . Worried About Programme researcher, broadcasting/film/video in the Last Year: Not on file  . Ran Out of Food in the Last Year: Not on file  Transportation Needs:   . Lack of Transportation (Medical): Not on file  . Lack of Transportation (Non-Medical): Not on file  Physical Activity:   . Days of Exercise per Week: Not on file  . Minutes of Exercise per Session: Not on file  Stress:   . Feeling of Stress : Not on file  Social Connections:    . Frequency of Communication with Friends and Family: Not on file  . Frequency of Social Gatherings with Friends and Family: Not on file  . Attends Religious Services: Not on file  . Active Member of Clubs or Organizations: Not on file  . Attends Banker Meetings: Not on file  . Marital Status: Not on file   SDOH:  SDOH Screenings   Alcohol Screen:   . Last Alcohol Screening Score (AUDIT): Not on file  Depression (PHQ2-9):   . PHQ-2 Score: Not on file  Financial Resource Strain:   . Difficulty of Paying Living Expenses: Not on file  Food Insecurity:   . Worried About Programme researcher, broadcasting/film/video in the Last Year: Not on file  . Ran Out of Food in the Last Year: Not on file  Housing:   . Last Housing Risk Score: Not on file  Physical Activity:   . Days of Exercise per Week: Not on file  . Minutes of Exercise per Session: Not on file  Social Connections:   . Frequency of Communication with Friends and Family: Not on file  . Frequency of Social Gatherings with Friends and Family: Not on file  . Attends Religious Services: Not on file  . Active Member of Clubs or Organizations: Not on file  . Attends Banker Meetings: Not on file  . Marital Status: Not on file  Stress:   . Feeling of Stress : Not on file  Tobacco Use: High Risk  . Smoking Tobacco Use: Current Every Day Smoker  . Smokeless Tobacco Use: Never Used  Transportation Needs:   . Freight forwarder (Medical): Not on file  . Lack of Transportation (Non-Medical): Not on file    Has this patient used any form of tobacco in the last 30 days? (Cigarettes, Smokeless Tobacco, Cigars, and/or Pipes) Prescription not provided because: Patient declined   Current Medications:  Current Facility-Administered Medications  Medication Dose Route Frequency Provider Last Rate Last Admin  . acetaminophen (TYLENOL) 325 MG tablet           . albuterol (VENTOLIN HFA) 108 (90 Base) MCG/ACT inhaler 2 puff  2 puff  Inhalation Q4H PRN Rankin, Shuvon B, NP      . hydrOXYzine (ATARAX/VISTARIL) tablet 25 mg  25 mg Oral Q6H PRN Rankin, Shuvon B, NP      . lurasidone (LATUDA) tablet 40 mg  40 mg Oral Q breakfast Rankin, Shuvon B, NP   40 mg at 07/10/20 0830  . nicotine polacrilex (NICORETTE) gum 2 mg  2 mg Oral PRN Nira Conn A, NP   2 mg at 07/10/20 0916  . traZODone (DESYREL)  tablet 50 mg  50 mg Oral QHS Rankin, Shuvon B, NP   50 mg at 07/09/20 2049   Current Outpatient Medications  Medication Sig Dispense Refill  . albuterol (PROVENTIL) (2.5 MG/3ML) 0.083% nebulizer solution Take 3 mLs (2.5 mg total) by nebulization every 6 (six) hours as needed for wheezing or shortness of breath. (Patient not taking: Reported on 07/28/2019) 75 mL 12  . albuterol (VENTOLIN HFA) 108 (90 Base) MCG/ACT inhaler Inhale 2 puffs into the lungs every 4 (four) hours as needed for wheezing or shortness of breath. (Patient not taking: Reported on 03/25/2020) 6.7 g 0  . Aspirin-Acetaminophen (GOODYS BODY PAIN PO) Take 1 Package by mouth daily as needed (back pain).     Marland Kitchen. doxycycline (VIBRAMYCIN) 100 MG capsule Take 1 capsule (100 mg total) by mouth 2 (two) times daily. (Patient not taking: Reported on 07/28/2019) 20 capsule 0  . gabapentin (NEURONTIN) 600 MG tablet Take 600 mg by mouth 3 (three) times daily.     Marland Kitchen. HYDROcodone-homatropine (HYCODAN) 5-1.5 MG/5ML syrup Take 5 mLs by mouth every 6 (six) hours as needed for cough. (Patient not taking: Reported on 07/28/2019) 120 mL 0  . hydrOXYzine (VISTARIL) 25 MG capsule Take 1 capsule by mouth every 6 (six) hours as needed for anxiety.    Marland Kitchen. lisinopril (PRINIVIL,ZESTRIL) 10 MG tablet Take 10 mg by mouth daily.    Marland Kitchen. lurasidone (LATUDA) 40 MG TABS tablet Take 1 tablet (40 mg total) by mouth daily with breakfast. 30 tablet 0  . nicotine (EQ NICOTINE) 14 mg/24hr patch Place 1 patch (14 mg total) onto the skin daily. (Patient not taking: Reported on 07/28/2019) 28 patch 0  . omeprazole (PRILOSEC) 20  MG capsule Take 20 mg by mouth daily.    . traZODone (DESYREL) 50 MG tablet Take 1 tablet (50 mg total) by mouth at bedtime. 15 tablet 0    PTA Medications: (Not in a hospital admission)   Musculoskeletal  Strength & Muscle Tone: within normal limits Gait & Station: normal Patient leans: Right  Psychiatric Specialty Exam  Presentation  General Appearance: Appropriate for Environment  Eye Contact:Good  Speech:Clear and Coherent  Speech Volume:Normal  Handedness:Right   Mood and Affect  Mood:Anxious  Affect:Appropriate   Thought Process  Thought Processes:Coherent;Goal Directed  Descriptions of Associations:Intact  Orientation:Full (Time, Place and Person)  Thought Content:WDL  Hallucinations:Hallucinations: Auditory Description of Auditory Hallucinations: "I hear people whispering about me."  Ideas of Reference:None  Suicidal Thoughts:Suicidal Thoughts: No SI Active Intent and/or Plan: Without Intent;With Plan;With Means to Carry Out  Homicidal Thoughts:Homicidal Thoughts: No   Sensorium  Memory:Immediate Fair;Recent Fair;Remote Fair  Judgment:Fair  Insight:Fair   Executive Functions  Concentration:Fair  Attention Span:Fair  Recall:Fair  Fund of Knowledge:Fair  Language:Fair   Psychomotor Activity  Psychomotor Activity:Psychomotor Activity: Normal Extrapyramidal Side Effects (EPS): Akathisia AIMS Completed?: No   Assets  Assets:Leisure Time;Physical Health;Desire for Improvement;Social Support;Housing   Sleep  Sleep:Sleep: Fair   Physical Exam  Physical Exam Vitals and nursing note reviewed.  Constitutional:      Appearance: She is well-developed.  Cardiovascular:     Rate and Rhythm: Normal rate.  Pulmonary:     Effort: Pulmonary effort is normal.  Musculoskeletal:        General: Normal range of motion.  Skin:    General: Skin is warm.  Neurological:     Mental Status: She is alert and oriented to person, place, and  time.    Review of Systems  Constitutional:  Negative.   HENT: Negative.   Eyes: Negative.   Respiratory: Negative.   Cardiovascular: Negative.   Gastrointestinal: Negative.   Genitourinary: Negative.   Musculoskeletal: Negative.   Skin: Negative.   Neurological: Negative.   Endo/Heme/Allergies: Negative.   Psychiatric/Behavioral: Positive for hallucinations.   Blood pressure (!) 143/108, pulse 70, temperature 98.1 F (36.7 C), temperature source Oral, resp. rate 18, height 5\' 9"  (1.753 m), weight 205 lb (93 kg), SpO2 99 %. Body mass index is 30.27 kg/m.   Plan Of Care/Follow-up recommendations:  Continue activity as tolerated. Continue diet as recommended by your PCP. Ensure to keep all appointments with outpatient providers.  Disposition: Continue to follow up appointment at [facility] in [place] on July 24, 2020. Continue taking Latuda 40 mg PO daily and Trazodone 100 mg PO at bedtime.   July 26, 2020 Susan Westerhold, Susan Bryan 07/10/2020, 9:46 AM

## 2020-07-10 NOTE — ED Notes (Signed)
Pt sleeping in no acute distress. Safety maintained. 

## 2020-07-10 NOTE — ED Notes (Signed)
Safe transport called to take pt to home address

## 2020-07-10 NOTE — ED Notes (Signed)
Pt denies SI/HI/AVH at present. Calm, cooperative with staff. Pt ate bkft. Requesting to discharge. NP notified. No acute distress noted. Safety maintained.

## 2020-07-10 NOTE — ED Notes (Signed)
Currently playing cards with another pt. No acute distress noted. Safety maintained.

## 2020-07-10 NOTE — ED Notes (Signed)
Pt sleeping at present, no distress noted, monitoring for safety. 

## 2020-07-10 NOTE — ED Notes (Signed)
BP rechecked 136/84.

## 2020-09-04 DIAGNOSIS — F3181 Bipolar II disorder: Secondary | ICD-10-CM | POA: Diagnosis not present

## 2020-09-11 DIAGNOSIS — M5136 Other intervertebral disc degeneration, lumbar region: Secondary | ICD-10-CM | POA: Diagnosis not present

## 2020-09-11 DIAGNOSIS — R062 Wheezing: Secondary | ICD-10-CM | POA: Diagnosis not present

## 2020-09-11 DIAGNOSIS — M545 Low back pain, unspecified: Secondary | ICD-10-CM | POA: Diagnosis not present

## 2020-09-11 DIAGNOSIS — M5432 Sciatica, left side: Secondary | ICD-10-CM | POA: Diagnosis not present

## 2020-09-11 DIAGNOSIS — M5442 Lumbago with sciatica, left side: Secondary | ICD-10-CM | POA: Diagnosis not present

## 2020-09-11 DIAGNOSIS — M5126 Other intervertebral disc displacement, lumbar region: Secondary | ICD-10-CM | POA: Diagnosis not present

## 2020-10-03 DIAGNOSIS — F3181 Bipolar II disorder: Secondary | ICD-10-CM | POA: Diagnosis not present

## 2020-11-04 DIAGNOSIS — M545 Low back pain, unspecified: Secondary | ICD-10-CM | POA: Diagnosis not present

## 2020-11-07 DIAGNOSIS — F1520 Other stimulant dependence, uncomplicated: Secondary | ICD-10-CM | POA: Diagnosis not present

## 2020-11-07 DIAGNOSIS — F319 Bipolar disorder, unspecified: Secondary | ICD-10-CM | POA: Diagnosis not present

## 2020-11-07 DIAGNOSIS — R059 Cough, unspecified: Secondary | ICD-10-CM | POA: Diagnosis not present

## 2020-11-07 DIAGNOSIS — J069 Acute upper respiratory infection, unspecified: Secondary | ICD-10-CM | POA: Diagnosis not present

## 2020-11-07 DIAGNOSIS — F1120 Opioid dependence, uncomplicated: Secondary | ICD-10-CM | POA: Diagnosis not present

## 2020-11-07 DIAGNOSIS — F314 Bipolar disorder, current episode depressed, severe, without psychotic features: Secondary | ICD-10-CM | POA: Diagnosis not present

## 2020-11-07 DIAGNOSIS — J449 Chronic obstructive pulmonary disease, unspecified: Secondary | ICD-10-CM | POA: Diagnosis not present

## 2020-11-07 DIAGNOSIS — F17210 Nicotine dependence, cigarettes, uncomplicated: Secondary | ICD-10-CM | POA: Diagnosis not present

## 2020-11-07 DIAGNOSIS — R0602 Shortness of breath: Secondary | ICD-10-CM | POA: Diagnosis not present

## 2020-11-07 DIAGNOSIS — J208 Acute bronchitis due to other specified organisms: Secondary | ICD-10-CM | POA: Diagnosis not present

## 2020-11-07 DIAGNOSIS — Z20822 Contact with and (suspected) exposure to covid-19: Secondary | ICD-10-CM | POA: Diagnosis not present

## 2020-11-07 DIAGNOSIS — B182 Chronic viral hepatitis C: Secondary | ICD-10-CM | POA: Diagnosis not present

## 2020-11-07 DIAGNOSIS — J441 Chronic obstructive pulmonary disease with (acute) exacerbation: Secondary | ICD-10-CM | POA: Diagnosis not present

## 2020-11-07 DIAGNOSIS — R079 Chest pain, unspecified: Secondary | ICD-10-CM | POA: Diagnosis not present

## 2020-11-07 DIAGNOSIS — B9789 Other viral agents as the cause of diseases classified elsewhere: Secondary | ICD-10-CM | POA: Diagnosis not present

## 2020-11-08 DIAGNOSIS — R0602 Shortness of breath: Secondary | ICD-10-CM | POA: Diagnosis not present

## 2020-11-08 DIAGNOSIS — R079 Chest pain, unspecified: Secondary | ICD-10-CM | POA: Diagnosis not present

## 2020-12-02 DIAGNOSIS — F1120 Opioid dependence, uncomplicated: Secondary | ICD-10-CM | POA: Diagnosis not present

## 2020-12-03 DIAGNOSIS — F419 Anxiety disorder, unspecified: Secondary | ICD-10-CM | POA: Diagnosis not present

## 2020-12-04 DIAGNOSIS — F1120 Opioid dependence, uncomplicated: Secondary | ICD-10-CM | POA: Diagnosis not present

## 2020-12-05 DIAGNOSIS — F1120 Opioid dependence, uncomplicated: Secondary | ICD-10-CM | POA: Diagnosis not present

## 2020-12-06 DIAGNOSIS — F1120 Opioid dependence, uncomplicated: Secondary | ICD-10-CM | POA: Diagnosis not present

## 2020-12-06 DIAGNOSIS — F1520 Other stimulant dependence, uncomplicated: Secondary | ICD-10-CM | POA: Diagnosis not present

## 2020-12-07 DIAGNOSIS — F1120 Opioid dependence, uncomplicated: Secondary | ICD-10-CM | POA: Diagnosis not present

## 2020-12-17 DIAGNOSIS — Z1231 Encounter for screening mammogram for malignant neoplasm of breast: Secondary | ICD-10-CM | POA: Diagnosis not present

## 2020-12-17 DIAGNOSIS — I10 Essential (primary) hypertension: Secondary | ICD-10-CM | POA: Diagnosis not present

## 2020-12-17 DIAGNOSIS — M545 Low back pain, unspecified: Secondary | ICD-10-CM | POA: Diagnosis not present

## 2020-12-17 DIAGNOSIS — K219 Gastro-esophageal reflux disease without esophagitis: Secondary | ICD-10-CM | POA: Diagnosis not present

## 2020-12-17 DIAGNOSIS — G8929 Other chronic pain: Secondary | ICD-10-CM | POA: Diagnosis not present

## 2020-12-17 DIAGNOSIS — F319 Bipolar disorder, unspecified: Secondary | ICD-10-CM | POA: Diagnosis not present

## 2020-12-23 DIAGNOSIS — R7301 Impaired fasting glucose: Secondary | ICD-10-CM | POA: Diagnosis not present

## 2020-12-23 DIAGNOSIS — R768 Other specified abnormal immunological findings in serum: Secondary | ICD-10-CM | POA: Diagnosis not present

## 2020-12-23 DIAGNOSIS — Z0000 Encounter for general adult medical examination without abnormal findings: Secondary | ICD-10-CM | POA: Diagnosis not present

## 2021-01-01 DIAGNOSIS — F3181 Bipolar II disorder: Secondary | ICD-10-CM | POA: Diagnosis not present

## 2021-01-01 DIAGNOSIS — R6889 Other general symptoms and signs: Secondary | ICD-10-CM | POA: Diagnosis not present

## 2021-01-03 DIAGNOSIS — A419 Sepsis, unspecified organism: Secondary | ICD-10-CM | POA: Diagnosis not present

## 2021-01-03 DIAGNOSIS — T50904A Poisoning by unspecified drugs, medicaments and biological substances, undetermined, initial encounter: Secondary | ICD-10-CM | POA: Diagnosis not present

## 2021-01-03 DIAGNOSIS — F1910 Other psychoactive substance abuse, uncomplicated: Secondary | ICD-10-CM | POA: Diagnosis not present

## 2021-01-03 DIAGNOSIS — R4020 Unspecified coma: Secondary | ICD-10-CM | POA: Diagnosis not present

## 2021-01-03 DIAGNOSIS — E860 Dehydration: Secondary | ICD-10-CM | POA: Diagnosis not present

## 2021-01-03 DIAGNOSIS — J9811 Atelectasis: Secondary | ICD-10-CM | POA: Diagnosis not present

## 2021-01-03 DIAGNOSIS — J9602 Acute respiratory failure with hypercapnia: Secondary | ICD-10-CM | POA: Diagnosis not present

## 2021-01-03 DIAGNOSIS — I10 Essential (primary) hypertension: Secondary | ICD-10-CM | POA: Diagnosis not present

## 2021-01-03 DIAGNOSIS — J90 Pleural effusion, not elsewhere classified: Secondary | ICD-10-CM | POA: Diagnosis not present

## 2021-01-03 DIAGNOSIS — J9690 Respiratory failure, unspecified, unspecified whether with hypoxia or hypercapnia: Secondary | ICD-10-CM | POA: Diagnosis not present

## 2021-01-03 DIAGNOSIS — R0689 Other abnormalities of breathing: Secondary | ICD-10-CM | POA: Diagnosis not present

## 2021-01-03 DIAGNOSIS — R404 Transient alteration of awareness: Secondary | ICD-10-CM | POA: Diagnosis not present

## 2021-01-03 DIAGNOSIS — J189 Pneumonia, unspecified organism: Secondary | ICD-10-CM | POA: Diagnosis not present

## 2021-01-03 DIAGNOSIS — R400 Somnolence: Secondary | ICD-10-CM | POA: Diagnosis not present

## 2021-01-03 DIAGNOSIS — I110 Hypertensive heart disease with heart failure: Secondary | ICD-10-CM | POA: Diagnosis not present

## 2021-01-03 DIAGNOSIS — T401X4A Poisoning by heroin, undetermined, initial encounter: Secondary | ICD-10-CM | POA: Diagnosis not present

## 2021-01-03 DIAGNOSIS — R4182 Altered mental status, unspecified: Secondary | ICD-10-CM | POA: Diagnosis not present

## 2021-01-03 DIAGNOSIS — T887XXA Unspecified adverse effect of drug or medicament, initial encounter: Secondary | ICD-10-CM | POA: Diagnosis not present

## 2021-01-03 DIAGNOSIS — F319 Bipolar disorder, unspecified: Secondary | ICD-10-CM | POA: Diagnosis not present

## 2021-01-03 DIAGNOSIS — Z720 Tobacco use: Secondary | ICD-10-CM | POA: Diagnosis not present

## 2021-01-03 DIAGNOSIS — E875 Hyperkalemia: Secondary | ICD-10-CM | POA: Diagnosis not present

## 2021-01-03 DIAGNOSIS — I509 Heart failure, unspecified: Secondary | ICD-10-CM | POA: Diagnosis not present

## 2021-01-03 DIAGNOSIS — F1020 Alcohol dependence, uncomplicated: Secondary | ICD-10-CM | POA: Diagnosis not present

## 2021-01-03 DIAGNOSIS — J690 Pneumonitis due to inhalation of food and vomit: Secondary | ICD-10-CM | POA: Diagnosis not present

## 2021-01-03 DIAGNOSIS — G8929 Other chronic pain: Secondary | ICD-10-CM | POA: Diagnosis not present

## 2021-01-03 DIAGNOSIS — Z20822 Contact with and (suspected) exposure to covid-19: Secondary | ICD-10-CM | POA: Diagnosis not present

## 2021-01-03 DIAGNOSIS — R059 Cough, unspecified: Secondary | ICD-10-CM | POA: Diagnosis not present

## 2021-01-03 DIAGNOSIS — N179 Acute kidney failure, unspecified: Secondary | ICD-10-CM | POA: Diagnosis not present

## 2021-01-04 DIAGNOSIS — I498 Other specified cardiac arrhythmias: Secondary | ICD-10-CM | POA: Diagnosis not present

## 2021-01-05 DIAGNOSIS — R400 Somnolence: Secondary | ICD-10-CM | POA: Diagnosis not present

## 2021-01-05 DIAGNOSIS — E875 Hyperkalemia: Secondary | ICD-10-CM | POA: Diagnosis not present

## 2021-01-05 DIAGNOSIS — Z720 Tobacco use: Secondary | ICD-10-CM | POA: Diagnosis not present

## 2021-01-05 DIAGNOSIS — J690 Pneumonitis due to inhalation of food and vomit: Secondary | ICD-10-CM | POA: Diagnosis not present

## 2021-01-05 DIAGNOSIS — I509 Heart failure, unspecified: Secondary | ICD-10-CM | POA: Diagnosis not present

## 2021-01-05 DIAGNOSIS — F1910 Other psychoactive substance abuse, uncomplicated: Secondary | ICD-10-CM | POA: Diagnosis not present

## 2021-01-05 DIAGNOSIS — J9602 Acute respiratory failure with hypercapnia: Secondary | ICD-10-CM | POA: Diagnosis not present

## 2021-01-05 DIAGNOSIS — G8929 Other chronic pain: Secondary | ICD-10-CM | POA: Diagnosis not present

## 2021-01-05 DIAGNOSIS — I110 Hypertensive heart disease with heart failure: Secondary | ICD-10-CM | POA: Diagnosis not present

## 2021-01-05 DIAGNOSIS — Z6841 Body Mass Index (BMI) 40.0 and over, adult: Secondary | ICD-10-CM | POA: Diagnosis not present

## 2021-01-05 DIAGNOSIS — F1020 Alcohol dependence, uncomplicated: Secondary | ICD-10-CM | POA: Diagnosis not present

## 2021-01-05 DIAGNOSIS — T401X4A Poisoning by heroin, undetermined, initial encounter: Secondary | ICD-10-CM | POA: Diagnosis not present

## 2021-01-05 DIAGNOSIS — N179 Acute kidney failure, unspecified: Secondary | ICD-10-CM | POA: Diagnosis not present

## 2021-01-22 DIAGNOSIS — R6889 Other general symptoms and signs: Secondary | ICD-10-CM | POA: Diagnosis not present

## 2021-02-03 DIAGNOSIS — Z09 Encounter for follow-up examination after completed treatment for conditions other than malignant neoplasm: Secondary | ICD-10-CM | POA: Diagnosis not present

## 2021-02-03 DIAGNOSIS — J449 Chronic obstructive pulmonary disease, unspecified: Secondary | ICD-10-CM | POA: Diagnosis not present

## 2021-02-03 DIAGNOSIS — J690 Pneumonitis due to inhalation of food and vomit: Secondary | ICD-10-CM | POA: Diagnosis not present

## 2021-02-03 DIAGNOSIS — T50901A Poisoning by unspecified drugs, medicaments and biological substances, accidental (unintentional), initial encounter: Secondary | ICD-10-CM | POA: Diagnosis not present

## 2021-02-03 DIAGNOSIS — R6889 Other general symptoms and signs: Secondary | ICD-10-CM | POA: Diagnosis not present

## 2021-03-06 DIAGNOSIS — F319 Bipolar disorder, unspecified: Secondary | ICD-10-CM | POA: Diagnosis not present

## 2021-03-06 DIAGNOSIS — F1120 Opioid dependence, uncomplicated: Secondary | ICD-10-CM | POA: Diagnosis not present

## 2021-03-06 DIAGNOSIS — J449 Chronic obstructive pulmonary disease, unspecified: Secondary | ICD-10-CM | POA: Diagnosis not present

## 2021-03-06 DIAGNOSIS — M545 Low back pain, unspecified: Secondary | ICD-10-CM | POA: Diagnosis not present

## 2021-03-06 DIAGNOSIS — I10 Essential (primary) hypertension: Secondary | ICD-10-CM | POA: Diagnosis not present

## 2021-03-06 DIAGNOSIS — G8929 Other chronic pain: Secondary | ICD-10-CM | POA: Diagnosis not present

## 2021-03-06 DIAGNOSIS — E782 Mixed hyperlipidemia: Secondary | ICD-10-CM | POA: Diagnosis not present

## 2021-03-06 DIAGNOSIS — R739 Hyperglycemia, unspecified: Secondary | ICD-10-CM | POA: Diagnosis not present

## 2021-03-06 DIAGNOSIS — K219 Gastro-esophageal reflux disease without esophagitis: Secondary | ICD-10-CM | POA: Diagnosis not present

## 2021-03-19 DIAGNOSIS — Z6833 Body mass index (BMI) 33.0-33.9, adult: Secondary | ICD-10-CM | POA: Diagnosis not present

## 2021-03-19 DIAGNOSIS — Z79899 Other long term (current) drug therapy: Secondary | ICD-10-CM | POA: Diagnosis not present

## 2021-03-19 DIAGNOSIS — F1120 Opioid dependence, uncomplicated: Secondary | ICD-10-CM | POA: Diagnosis not present

## 2021-03-19 DIAGNOSIS — F19980 Other psychoactive substance use, unspecified with psychoactive substance-induced anxiety disorder: Secondary | ICD-10-CM | POA: Diagnosis not present

## 2021-03-19 DIAGNOSIS — Z79891 Long term (current) use of opiate analgesic: Secondary | ICD-10-CM | POA: Diagnosis not present

## 2021-03-19 DIAGNOSIS — F319 Bipolar disorder, unspecified: Secondary | ICD-10-CM | POA: Diagnosis not present

## 2021-03-19 DIAGNOSIS — R768 Other specified abnormal immunological findings in serum: Secondary | ICD-10-CM | POA: Diagnosis not present

## 2021-03-31 DIAGNOSIS — F319 Bipolar disorder, unspecified: Secondary | ICD-10-CM | POA: Diagnosis not present

## 2021-03-31 DIAGNOSIS — R768 Other specified abnormal immunological findings in serum: Secondary | ICD-10-CM | POA: Diagnosis not present

## 2021-03-31 DIAGNOSIS — Z79899 Other long term (current) drug therapy: Secondary | ICD-10-CM | POA: Diagnosis not present

## 2021-03-31 DIAGNOSIS — F19980 Other psychoactive substance use, unspecified with psychoactive substance-induced anxiety disorder: Secondary | ICD-10-CM | POA: Diagnosis not present

## 2021-03-31 DIAGNOSIS — F1120 Opioid dependence, uncomplicated: Secondary | ICD-10-CM | POA: Diagnosis not present

## 2021-03-31 DIAGNOSIS — Z6833 Body mass index (BMI) 33.0-33.9, adult: Secondary | ICD-10-CM | POA: Diagnosis not present

## 2021-05-22 DIAGNOSIS — R109 Unspecified abdominal pain: Secondary | ICD-10-CM | POA: Diagnosis not present

## 2021-05-22 DIAGNOSIS — R1011 Right upper quadrant pain: Secondary | ICD-10-CM | POA: Diagnosis not present

## 2021-05-22 DIAGNOSIS — R112 Nausea with vomiting, unspecified: Secondary | ICD-10-CM | POA: Diagnosis not present

## 2021-05-22 DIAGNOSIS — K7689 Other specified diseases of liver: Secondary | ICD-10-CM | POA: Diagnosis not present

## 2021-07-02 DIAGNOSIS — F1010 Alcohol abuse, uncomplicated: Secondary | ICD-10-CM | POA: Diagnosis not present

## 2021-07-02 DIAGNOSIS — M47817 Spondylosis without myelopathy or radiculopathy, lumbosacral region: Secondary | ICD-10-CM | POA: Diagnosis not present

## 2021-07-02 DIAGNOSIS — Z79899 Other long term (current) drug therapy: Secondary | ICD-10-CM | POA: Diagnosis not present

## 2021-07-02 DIAGNOSIS — M48062 Spinal stenosis, lumbar region with neurogenic claudication: Secondary | ICD-10-CM | POA: Diagnosis not present

## 2021-07-02 DIAGNOSIS — Z8619 Personal history of other infectious and parasitic diseases: Secondary | ICD-10-CM | POA: Diagnosis not present

## 2021-07-02 DIAGNOSIS — F1110 Opioid abuse, uncomplicated: Secondary | ICD-10-CM | POA: Diagnosis not present

## 2021-07-02 DIAGNOSIS — R32 Unspecified urinary incontinence: Secondary | ICD-10-CM | POA: Diagnosis not present

## 2021-07-02 DIAGNOSIS — F17210 Nicotine dependence, cigarettes, uncomplicated: Secondary | ICD-10-CM | POA: Diagnosis not present

## 2021-07-02 DIAGNOSIS — M5126 Other intervertebral disc displacement, lumbar region: Secondary | ICD-10-CM | POA: Diagnosis not present

## 2021-07-02 DIAGNOSIS — M545 Low back pain, unspecified: Secondary | ICD-10-CM | POA: Diagnosis not present

## 2021-07-02 DIAGNOSIS — M5441 Lumbago with sciatica, right side: Secondary | ICD-10-CM | POA: Diagnosis not present

## 2021-07-02 DIAGNOSIS — I10 Essential (primary) hypertension: Secondary | ICD-10-CM | POA: Diagnosis not present

## 2021-07-02 DIAGNOSIS — B1920 Unspecified viral hepatitis C without hepatic coma: Secondary | ICD-10-CM | POA: Diagnosis not present

## 2021-07-03 DIAGNOSIS — M47816 Spondylosis without myelopathy or radiculopathy, lumbar region: Secondary | ICD-10-CM | POA: Diagnosis not present

## 2021-07-03 DIAGNOSIS — M48062 Spinal stenosis, lumbar region with neurogenic claudication: Secondary | ICD-10-CM | POA: Diagnosis not present

## 2021-07-03 DIAGNOSIS — Z01818 Encounter for other preprocedural examination: Secondary | ICD-10-CM | POA: Diagnosis not present

## 2021-07-03 DIAGNOSIS — M419 Scoliosis, unspecified: Secondary | ICD-10-CM | POA: Diagnosis not present

## 2021-08-07 DIAGNOSIS — W19XXXA Unspecified fall, initial encounter: Secondary | ICD-10-CM | POA: Diagnosis not present

## 2021-08-07 DIAGNOSIS — M5136 Other intervertebral disc degeneration, lumbar region: Secondary | ICD-10-CM | POA: Diagnosis not present

## 2021-08-07 DIAGNOSIS — M47816 Spondylosis without myelopathy or radiculopathy, lumbar region: Secondary | ICD-10-CM | POA: Diagnosis not present

## 2021-08-07 DIAGNOSIS — Y999 Unspecified external cause status: Secondary | ICD-10-CM | POA: Diagnosis not present

## 2021-08-07 DIAGNOSIS — R000 Tachycardia, unspecified: Secondary | ICD-10-CM | POA: Diagnosis not present

## 2021-08-07 DIAGNOSIS — Z043 Encounter for examination and observation following other accident: Secondary | ICD-10-CM | POA: Diagnosis not present

## 2021-08-25 DIAGNOSIS — M5126 Other intervertebral disc displacement, lumbar region: Secondary | ICD-10-CM | POA: Diagnosis not present

## 2021-08-25 DIAGNOSIS — M48061 Spinal stenosis, lumbar region without neurogenic claudication: Secondary | ICD-10-CM | POA: Diagnosis not present

## 2021-08-25 DIAGNOSIS — M5136 Other intervertebral disc degeneration, lumbar region: Secondary | ICD-10-CM | POA: Diagnosis not present

## 2021-09-17 DIAGNOSIS — I10 Essential (primary) hypertension: Secondary | ICD-10-CM | POA: Diagnosis not present

## 2021-09-17 DIAGNOSIS — E669 Obesity, unspecified: Secondary | ICD-10-CM | POA: Diagnosis not present

## 2021-09-17 DIAGNOSIS — F603 Borderline personality disorder: Secondary | ICD-10-CM | POA: Diagnosis not present

## 2021-09-17 DIAGNOSIS — M5126 Other intervertebral disc displacement, lumbar region: Secondary | ICD-10-CM | POA: Diagnosis not present

## 2021-09-17 DIAGNOSIS — M48062 Spinal stenosis, lumbar region with neurogenic claudication: Secondary | ICD-10-CM | POA: Diagnosis not present

## 2021-09-17 DIAGNOSIS — Z6831 Body mass index (BMI) 31.0-31.9, adult: Secondary | ICD-10-CM | POA: Diagnosis not present

## 2021-09-17 DIAGNOSIS — F314 Bipolar disorder, current episode depressed, severe, without psychotic features: Secondary | ICD-10-CM | POA: Diagnosis not present

## 2021-09-17 DIAGNOSIS — F17210 Nicotine dependence, cigarettes, uncomplicated: Secondary | ICD-10-CM | POA: Diagnosis not present

## 2021-09-17 DIAGNOSIS — J449 Chronic obstructive pulmonary disease, unspecified: Secondary | ICD-10-CM | POA: Diagnosis not present

## 2021-09-17 DIAGNOSIS — Z981 Arthrodesis status: Secondary | ICD-10-CM | POA: Diagnosis not present

## 2021-09-17 DIAGNOSIS — B182 Chronic viral hepatitis C: Secondary | ICD-10-CM | POA: Diagnosis not present

## 2021-09-17 DIAGNOSIS — M5136 Other intervertebral disc degeneration, lumbar region: Secondary | ICD-10-CM | POA: Diagnosis not present

## 2022-03-07 ENCOUNTER — Ambulatory Visit (HOSPITAL_COMMUNITY)
Admission: EM | Admit: 2022-03-07 | Discharge: 2022-03-07 | Disposition: A | Discharge status: Home / Self Care | Payer: Medicare HMO | Attending: Psychiatry | Admitting: Psychiatry

## 2022-03-07 DIAGNOSIS — Y939 Activity, unspecified: Secondary | ICD-10-CM | POA: Insufficient documentation

## 2022-03-07 DIAGNOSIS — F319 Bipolar disorder, unspecified: Secondary | ICD-10-CM | POA: Insufficient documentation

## 2022-03-07 DIAGNOSIS — X789XXA Intentional self-harm by unspecified sharp object, initial encounter: Secondary | ICD-10-CM | POA: Insufficient documentation

## 2022-03-07 DIAGNOSIS — R45851 Suicidal ideations: Secondary | ICD-10-CM | POA: Diagnosis not present

## 2022-03-07 DIAGNOSIS — F1910 Other psychoactive substance abuse, uncomplicated: Secondary | ICD-10-CM | POA: Diagnosis not present
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
## 2022-03-07 NOTE — ED Notes (Signed)
Discharge instructions provided and Pt stated understanding. Pt alert, orient and ambulatory prior to d/c from facility. Personal belongings returned. Safety maintained.  

## 2022-03-07 NOTE — Discharge Instructions (Signed)
Substance Abuse Treatment Programs ° °Intensive Outpatient Programs °[facility]     °[address]      °[place], [place]                   °[phone]      ° °[facility] °[address] °[place], [place] °[phone] ° °[facility]     °(Inpatient and outpatient)     °[address]           °[phone]   ° °[facility] °[phone] (Suboxone and Methadone) ° °[address]      °[place], [place] [address]      °[phone]      ° °[address] °[place], [place] °[phone] ° °[facility] (Outpatient/Inpatient, Chemical)    °(insurance only) [phone]      °       °[facility] (Groups & Residential) °[place] °[phone] ° °   °[facility]     °[address]     °[place], [place]      °[phone]      ° °[facility] (for caregivers and family) °[address] °[place], [place] °[phone] ° ° ° ° ° °Residential Treatment Programs °[facility]      °[address], [place], [place] [address]  °([phone]      ° °[facility] °[address] [place], [place] [address] °[phone] ° °[facility]        °[phone]      ° °[facility] °[phone] ° °[facility]             °[address]                                         °[place], [place]                                                °[phone] or [phone]                              ° °[facility] [place] °[address] °[place], [address] °[phone] ° °[facility]    °[address]      °[place], [place]     °[phone]      ° °[facility] °[address] °, [place] °[phone] ° °[facility]   °[address]     °[place], [place] [address]     °[phone]      °Admissions: 8am-3pm M-F ° °[facility] °[address] °[place],  [place] °[phone] ° °[facility]: Residential Program (90 Days)   °[place], [place]      °336-725-8389 or [phone]    ° °[facility] °[place], [place] °(Walk in Hours over the weekend or by referral) ° °[facility] °[address], [place],  [address] °([phone] ° °Crisis Mobile: [facility]:  1-877-626-1772 (for crisis response 24 hours a day) °[facility] Hotline:      1-800-256-2452 °Outpatient Psychiatry and Counseling ° °[facility]: Mobile Crisis   Management 24 hours:  1-877-626-1772 ° °[facility] sliding scale fee and walk in schedule: M-F 8am-12pm/1pm-3pm °[address]  °[place], [place] [address] °[phone] ° °[facility] °[address] °[place], [place] [address] °[phone] ° °[facility] ([facility] known [facility])- new patient walk-in appointments available Monday - Friday 8am -3pm.          °[address] °[place], [place] [address] °336-676-6840 or crisis line- 336-676-6905 ° °[facility] Intensive Outpatient Therapy Program °[address] °[place], [place] [address] °336-832-9804 ° °[facility]                  °Crisis Services      °[phone]      °[address]     °[place], [place] [address]                ° °[facility]   °[facility] °[phone] °[address] °[place], [place] [address] ° ° °[facility]          °[address]  °[place], [place] [address]       °[phone] ° °[facility] °[address] °[place], [place] [address] °336-292-1510 ° °[facility]    °[address]    °[place], [place] [address]     °[phone]      ° °Parish McKinney, [place]     °[address]     °[place] [address]     °[phone]     °  °[facility] °[address] °[place] [address] ° °[facility]     °[address]     °[place], [place]      °336-542-2076      ° °[facility] °Shamsher Ahluwalia, MD °[address] °[place], [place] [address] °[phone] ° °[facility]     °[address]     °[place], [place] [address]     °336-274-1237      ° °[facility] °[address] °[place], [place] [address] °336-854-4450 °Resources for Temporary Residential Assistance/Crisis Centers ° °DAY CENTERS °[facility] °M-F 8am-3pm   °[address] [place], [place] [address]   336-332-0824 °Services include: laundry, barbering, support groups, case management, phone  & computer access, showers, AA/NA mtgs, mental health/substance abuse nurse, job skills class, disability information, VA assistance, spiritual classes, etc.  ° °HOMELESS SHELTERS ° °[facility]     °[facility]   °[address], [place]     °[phone]       °       °[facility] (women and children)       °[address] °[place], [place] [address] °336-275-0820 °[email or web address] for application and process °Application Required ° °[facility]   °[address]    °[place] [address]     °[phone]       °             °[facility] °[address] °[place], [place] [address] °[phone] °336-235-0363(schedule application appt.) °Application Required ° °[facility] (women only)    °[address]     °[place], [place] [address]     °336-884-1039      °  Intake starts 6pm daily °Need valid ID, SSC, & Police report °[facility] [place] °[address] °[place], [place] °336-881-5420 °Application Required ° °[facility] (men only)     °[address]      °[place], [place]     °[phone]      ° °[facility] °(Pregnant women only) °[address] °[place], [place] °336-275-0206 ° °[facility]      °[address]      °[place], [place] [address]     °[phone]      °       °[facility] °[address] °[place], [place] °336-723-1848 °90 day commitment/SA/Application process ° °[facility](men only)     °[address]     °[place], [place]     °[phone]       °Check-in at 7pm     °       °[facility] °[address] °[place], [place] [address] °336-248-6684 °Men/Women/Women and Children must be there by 7 pm ° °[facility] °[place], [place] °336-722-8721                ° °

## 2022-03-07 NOTE — ED Provider Notes (Signed)
Behavioral Health Urgent Care Medical Screening Exam ? ?Patient Name: Susan Bryan [facility] ?MRN: [id number] ?Date of Evaluation: 03/07/22 ?Chief Complaint:   ?Diagnosis:  ?Final diagnoses:  ?Polysubstance abuse (HCC)  ? ? ?History of Present illness: Susan Bryan is a 51 y.o. female patient presented to [facility] as a walk in accompanied by her daughter requesting detox from heroin.  ? ?Susan Bryan, 51 y.o., female patient seen face to face by this provider, consulted with Dr. Gasper Sells; and chart reviewed on 03/07/22. Patient has a past psychiatric history of Bipolar disorder, polysubstance abuse, heroin overdose. She has participated in substance abuse treatment at [facility] one year ago. She has not take any medications in over a year and has no out patient services in place.  ? ?On evaluation [facility] reports she has been snorting heroin for the past year roughly one gram per day. Her last use was this morning. She has been snorting methamphetamines for the past 5 years. Her last use was 3 days ago. She uses roughly 1 gram per day. She has had one period of sobriety that lasted about a year. She is currently experiencing withdrawal symptoms of cold sweats, nausea, stomach pains and agitation. She attempted to get treatment at [facility] in [place] yesterday but stated the wait was too long and she left AMA. She is requesting heroin detox. Offered admission to the [facility] for detox and to seek residential treatment. Also explained that suboxone is not prescribed at this facility. States "I cant do this without suboxone". Patient then requested to be discharged and asked for substance abuse resources that would include detox that offers suboxone.  ? ?During evaluation [facility] is in sitting position. She is anxious and irritable. She is alert/oriented x 4; and cooperative. She is speaking in a clear tone at moderate volume, and normal pace; with good eye contact.  There is no indication that she  is currently responding to internal/external stimuli or experiencing delusional or paranoid thought content. She is denying HI/AVH. She endorses recent self harm/cutting. She has superficial cuts on her left forearm. She endorses passive SI related to her substance addiction. She denies any intent or plan. She contracts for safety. States "I have my daughter with me I would not do anything that would hurt her".   ? ?At this time Susan Bryan is educated and verbalizes understanding of mental health resources and other crisis services in the community. She is instructed to call 911 and present to the nearest emergency room should she experience any suicidal/homicidal ideation, auditory/visual/hallucinations, or detrimental worsening of her mental health condition.  She was a also advised by Clinical research associate that  she could call the toll-free phone on insurance card to assist with identifying in network counselors and agencies or number on back of  insurance card to speak with care coordinator ?  ? ?Psychiatric Specialty Exam ? ?Presentation  ?General Appearance:Casual ? ?Eye Contact:Fair ? ?Speech:Clear and Coherent; Normal Rate ? ?Speech Volume:Normal ? ?Handedness:Right ? ? ?Mood and Affect  ?Mood:Anxious ? ?Affect:Congruent ? ? ?Thought Process  ?Thought Processes:Coherent ? ?Descriptions of Associations:Intact ? ?Orientation:Full (Time, Place and Person) ? ?Thought Content:Logical ?   Hallucinations:None ? ?Ideas of Reference:None ? ?Suicidal Thoughts:No ? ?Homicidal Thoughts:No ? ? ?Sensorium  ?Memory:Immediate Good; Recent Good; Remote Good ? ?Judgment:Fair ? ?Insight:Fair ? ? ?Executive Functions  ?Concentration:Good ? ?Attention Span:Good ? ?Recall:Good ? ?Fund of Knowledge:Good ? ?Language:Good ? ? ?Psychomotor Activity  ?Psychomotor Activity:Normal ? ? ?  Assets  ?Assets:Communication Skills; Physical Health; Leisure Time; Resilience; Desire for Improvement ? ? ?Sleep  ?Sleep:Poor ? ?Number of hours: No data  recorded ? ?No data recorded ? ?Physical Exam: ?Physical Exam ?Vitals and nursing note reviewed.  ?Constitutional:   ?   General: She is not in acute distress. ?   Appearance: Normal appearance. She is not ill-appearing.  ?HENT:  ?   Head: Normocephalic.  ?Eyes:  ?   General:     ?   Right eye: No discharge.     ?   Left eye: No discharge.  ?   Conjunctiva/sclera: Conjunctivae normal.  ?   Pupils: Pupils are equal, round, and reactive to light.  ?Cardiovascular:  ?   Rate and Rhythm: Normal rate.  ?Pulmonary:  ?   Effort: Pulmonary effort is normal.  ?Musculoskeletal:     ?   General: Normal range of motion.  ?   Cervical back: Normal range of motion.  ?Skin: ?   General: Skin is warm and dry.  ?   Coloration: Skin is not jaundiced or pale.  ?Neurological:  ?   Mental Status: She is alert and oriented to person, place, and time.  ?Psychiatric:     ?   Attention and Perception: Attention and perception normal.     ?   Mood and Affect: Affect normal. Mood is anxious.     ?   Speech: Speech normal.     ?   Behavior: Behavior normal. Behavior is cooperative.     ?   Thought Content: Thought content includes suicidal ideation. Thought content does not include suicidal plan.     ?   Cognition and Memory: Cognition normal.     ?   Judgment: Judgment is impulsive.  ? ?Review of Systems  ?Constitutional: Negative.   ?HENT: Negative.    ?Eyes: Negative.   ?Respiratory: Negative.    ?Cardiovascular: Negative.   ?Musculoskeletal: Negative.   ?Skin: Negative.   ?Neurological: Negative.   ?Psychiatric/Behavioral:  Positive for substance abuse. The patient is nervous/anxious.   ?Blood pressure (!) 146/108, pulse 75, temperature 97.6 ?F (36.4 ?C), temperature source Oral, resp. rate 17, SpO2 100 %. There is no height or weight on file to calculate BMI. ? ?Musculoskeletal: ?Strength & Muscle Tone: within normal limits ?Gait & Station: normal ?Patient leans: N/A ? ? ?[facility] MSE Discharge Disposition for Follow up and  Recommendations: ?Based on my evaluation the patient does not appear to have an emergency medical condition and can be discharged with resources and follow up care in outpatient services for Medication Management, Substance Abuse Intensive Outpatient Program, and Individual Therapy ? ? ? ? ?Ardis Hughs, NP ?03/07/2022, 3:06 PM ? ?

## 2022-03-28 ENCOUNTER — Telehealth (HOSPITAL_COMMUNITY): Payer: Self-pay

## 2022-03-28 NOTE — BH Assessment (Signed)
Care Management - [facility] Follow Up Discharges   Writer attempted to make contact with patient today and was unsuccessful.  Writer left a HIPPA compliant voice message.   Per chart review, patient was provided with substance abuse outpatient resources.
